# Patient Record
Sex: Female | Born: 1985 | Race: Black or African American | Hispanic: No | Marital: Single | Smoking: Never smoker
Health system: Southern US, Community
[De-identification: ages and names within clinical notes are randomized; demographics above are authoritative.]

## PROBLEM LIST (undated history)

## (undated) DIAGNOSIS — Z789 Other specified health status: Secondary | ICD-10-CM

## (undated) HISTORY — PX: NO PAST SURGERIES: SHX2092

---

## 2015-03-25 ENCOUNTER — Emergency Department
Admission: EM | Admit: 2015-03-25 | Discharge: 2015-03-25 | Disposition: A | Discharge status: Home / Self Care | Payer: Self-pay | Attending: Emergency Medicine | Admitting: Emergency Medicine

## 2015-03-25 ENCOUNTER — Encounter: Payer: Self-pay | Admitting: *Deleted

## 2015-03-25 DIAGNOSIS — M65312 Trigger thumb, left thumb: Secondary | ICD-10-CM | POA: Insufficient documentation

## 2015-03-25 MED ORDER — NAPROXEN 500 MG PO TABS: 500.0000 mg | ORAL_TABLET | Freq: Two times a day (BID) | ORAL | Status: DC

## 2015-03-25 NOTE — ED Notes (Signed)
Left hand pain  

## 2015-03-25 NOTE — ED Provider Notes (Signed)
[facility] Emergency Department Provider Note  ____________________________________________  Time seen: Approximately 11:19 AM  I have reviewed the triage vital signs and the nursing notes.   HISTORY  Chief Complaint Hand Problem    HPI Kaitlin Potter is a 29 y.o. female who presents to the emergency department complaining of left thumb pain and "my thumb not working properly." She states that the symptoms have been increasing over the last month. It is all located around the MCP joint. She denies any numbness or tingling in thumb. She denies any previous injury. She states that she can extend and flex the MCP joint but cannot flex the DIP thumb joint. She states that she can't physically bend it but then the joint will "catch." Pain is mild, intermittent, not improved by over-the-counter medications   History reviewed. No pertinent past medical history.  There are no active problems to display for this patient.   History reviewed. No pertinent past surgical history.  Current Outpatient Rx  Name  Route  Sig  Dispense  Refill  . naproxen (NAPROSYN) 500 MG tablet   Oral   Take 1 tablet (500 mg total) by mouth 2 (two) times daily with a meal.   60 tablet   2     Allergies Review of patient's allergies indicates not on file.  No family history on file.  Social History Social History  Substance Use Topics  . Smoking status: Never Smoker   . Smokeless tobacco: None  . Alcohol Use: No    Review of Systems Constitutional: No fever/chills Eyes: No visual changes. ENT: No sore throat. Cardiovascular: Denies chest pain. Respiratory: Denies shortness of breath. Gastrointestinal: No abdominal pain.  No nausea, no vomiting.  No diarrhea.  No constipation. Genitourinary: Negative for dysuria. Musculoskeletal: Negative for back pain. Endorses left thumb pain and left thumb dysfunction. Skin: Negative for rash. Neurological: Negative for headaches,  focal weakness or numbness.  10-point ROS otherwise negative.  ____________________________________________   PHYSICAL EXAM:  VITAL SIGNS: ED Triage Vitals  Enc Vitals Group     BP 03/25/15 1101 138/64 mmHg     Pulse Rate 03/25/15 1101 77     Resp 03/25/15 1101 18     Temp 03/25/15 1101 98.9 F (37.2 C)     Temp Source 03/25/15 1101 Oral     SpO2 03/25/15 1101 98 %     Weight 03/25/15 1101 230 lb (104.327 kg)     Height 03/25/15 1101 5\' 5"  (1.651 m)     Head Cir --      Peak Flow --      Pain Score 03/25/15 1106 5     Pain Loc --      Pain Edu? --      Excl. in GC? --     Constitutional: Alert and oriented. Well appearing and in no acute distress. Eyes: Conjunctivae are normal. PERRL. EOMI. Head: Atraumatic. Nose: No congestion/rhinnorhea. Mouth/Throat: Mucous membranes are moist.  Oropharynx non-erythematous. Neck: No stridor.   Cardiovascular: Normal rate, regular rhythm. Grossly normal heart sounds.  Good peripheral circulation. Respiratory: Normal respiratory effort.  No retractions. Lungs CTAB. Gastrointestinal: Soft and nontender. No distention. No abdominal bruits. No CVA tenderness. Musculoskeletal: No lower extremity tenderness nor edema.  No joint effusions. No visible deformity to left thumb and compared with right. No edema. Patient is mildly tender to palpation over the sesamoid bones in the MCP joint. No palpable abnormality or deformity. Patient has full range of motion of  the MCP joint but no extension or flexion of the DIP joint. Finkelstein's is negative. When manually manipulated there is a catching sensation and finger has to be manually reduced to return to the extended position. Neurologic:  Normal speech and language. No gross focal neurologic deficits are appreciated. No gait instability. Skin:  Skin is warm, dry and intact. No rash noted. Psychiatric: Mood and affect are normal. Speech and behavior are  normal.  ____________________________________________   LABS (all labs ordered are listed, but only abnormal results are displayed)  Labs Reviewed - No data to display ____________________________________________  EKG   ____________________________________________  RADIOLOGY   ____________________________________________   PROCEDURES  Procedure(s) performed: None  Critical Care performed: No  ____________________________________________   INITIAL IMPRESSION / ASSESSMENT AND PLAN / ED COURSE  Pertinent labs & imaging results that were available during my care of the patient were reviewed by me and considered in my medical decision making (see chart for details).  She is history, symptoms, physical exam are consistent with trigger thumb. I advised patient of findings and diagnosis and she verbalizes understanding same. The patient will be given anti-inflammatories for symptomatic control until patient can see orthopedics. The patient is to be seen by orthopedics for further evaluation and treatment. ____________________________________________   FINAL CLINICAL IMPRESSION(S) / ED DIAGNOSES  Final diagnoses:  Trigger thumb of left hand      Racheal Patches, PA-C 03/25/15 1136  Myrna Blazer, MD 03/25/15 1404

## 2015-03-25 NOTE — Discharge Instructions (Signed)
Trigger Finger °Trigger finger (digital tendinitis and stenosing tenosynovitis) is a common disorder that causes an often painful catching of the fingers or thumb. It occurs as a clicking, snapping, or locking of a finger in the palm of the hand. This is caused by a problem with the tendons that flex or bend the fingers sliding smoothly through their sheaths. The condition may occur in any finger or a couple fingers at the same time.  °The finger may lock with the finger curled or suddenly straighten out with a snap. This is more common in patients with rheumatoid arthritis and diabetes. Left untreated, the condition may get worse to the point where the finger becomes locked in flexion, like making a fist, or less commonly locked with the finger straightened out. °CAUSES  °· Inflammation and scarring that lead to swelling around the tendon sheath. °· Repeated or forceful movements. °· Rheumatoid arthritis, an autoimmune disease that affects joints. °· Gout. °· Diabetes mellitus. °SIGNS AND SYMPTOMS °· Soreness and swelling of your finger. °· A painful clicking or snapping as you bend and straighten your finger. °DIAGNOSIS  °Your health care provider will do a physical exam of your finger to diagnose trigger finger. °TREATMENT  °· Splinting for 6-8 weeks may be helpful. °· Nonsteroidal anti-inflammatory medicines (NSAIDs) can help to relieve the pain and inflammation. °· Cortisone injections, along with splinting, may speed up recovery. Several injections may be required. Cortisone may give relief after one injection. °· Surgery is another treatment that may be used if conservative treatments do not work. Surgery can be minor, without incisions (a cut does not have to be made), and can be done with a needle through the skin. °· Other surgical choices involve an open procedure in which the surgeon opens the hand through a small incision and cuts the pulley so the tendon can again slide smoothly. Your hand will still  work fine. °HOME CARE INSTRUCTIONS °· Apply ice to the injured area, twice per day: °¨ Put ice in a plastic bag. °¨ Place a towel between your skin and the bag. °¨ Leave the ice on for 20 minutes, 3-4 times a day. °· Rest your hand often. °MAKE SURE YOU:  °· Understand these instructions. °· Will watch your condition. °· Will get help right away if you are not doing well or get worse. °  °This information is not intended to replace advice given to you by your health care provider. Make sure you discuss any questions you have with your health care provider. °  °Document Released: 01/31/2004 Document Revised: 12/13/2012 Document Reviewed: 09/12/2012 °Elsevier Interactive Patient Education ©2016 Elsevier Inc. ° °

## 2015-04-27 NOTE — L&D Delivery Note (Signed)
Delivery Note At 7:47 AM a viable female was delivered via Vaginal, Spontaneous Delivery (Presentation: direct OA).  APGAR , weight pending.   Placenta status: spontaneous, intact.  Cord: 3vessels,  with the following complications: tight nuchal unable to be reduced at perineum; delivered through.  Cord pH: not obtained  Anesthesia:  npne Episiotomy:  none Lacerations:  none Suture Repair: n/a Est. Blood Loss (mL):  200ml  Mom to postpartum.  Baby to Couplet care / Skin to Skin.  Precipitous delivery, tight nuchal.  Baby placed on mom's chest.  Cord was doubly clamped and cut by FOB.  IM pitocin given for PPH PPX. Placenta delivered intact, spontaneously.   We did sing Happy Birthday to [employer] Kaitlin Potter.  Chelsea C Ward 04/09/2016, 8:12 AM

## 2015-12-16 ENCOUNTER — Other Ambulatory Visit: Payer: Self-pay | Admitting: Family Medicine

## 2015-12-16 DIAGNOSIS — Z3482 Encounter for supervision of other normal pregnancy, second trimester: Secondary | ICD-10-CM

## 2015-12-22 ENCOUNTER — Ambulatory Visit
Admission: RE | Admit: 2015-12-22 | Discharge: 2015-12-22 | Disposition: A | Discharge status: Home / Self Care | Payer: Self-pay | Source: Ambulatory Visit | Attending: Family Medicine | Admitting: Family Medicine

## 2015-12-22 DIAGNOSIS — Z3482 Encounter for supervision of other normal pregnancy, second trimester: Secondary | ICD-10-CM | POA: Insufficient documentation

## 2015-12-22 DIAGNOSIS — Z3A22 22 weeks gestation of pregnancy: Secondary | ICD-10-CM | POA: Insufficient documentation

## 2016-04-09 ENCOUNTER — Inpatient Hospital Stay
Admission: EM | Admit: 2016-04-09 | Discharge: 2016-04-10 | DRG: 775 | Disposition: A | Discharge status: Home / Self Care | Payer: Medicaid Other | Attending: Obstetrics and Gynecology | Admitting: Obstetrics and Gynecology

## 2016-04-09 ENCOUNTER — Encounter: Payer: Self-pay | Admitting: *Deleted

## 2016-04-09 DIAGNOSIS — Z3A38 38 weeks gestation of pregnancy: Secondary | ICD-10-CM | POA: Diagnosis not present

## 2016-04-09 DIAGNOSIS — O479 False labor, unspecified: Secondary | ICD-10-CM | POA: Diagnosis present

## 2016-04-09 DIAGNOSIS — E669 Obesity, unspecified: Secondary | ICD-10-CM | POA: Diagnosis present

## 2016-04-09 DIAGNOSIS — O99214 Obesity complicating childbirth: Secondary | ICD-10-CM | POA: Diagnosis present

## 2016-04-09 DIAGNOSIS — Z6841 Body Mass Index (BMI) 40.0 and over, adult: Secondary | ICD-10-CM

## 2016-04-09 DIAGNOSIS — O99210 Obesity complicating pregnancy, unspecified trimester: Secondary | ICD-10-CM | POA: Diagnosis present

## 2016-04-09 DIAGNOSIS — O0993 Supervision of high risk pregnancy, unspecified, third trimester: Secondary | ICD-10-CM

## 2016-04-09 DIAGNOSIS — Z3493 Encounter for supervision of normal pregnancy, unspecified, third trimester: Secondary | ICD-10-CM | POA: Diagnosis present

## 2016-04-09 HISTORY — DX: Other specified health status: Z78.9

## 2016-04-09 LAB — TYPE AND SCREEN
ABO/RH(D): A POS
ANTIBODY SCREEN: NEGATIVE

## 2016-04-09 LAB — CBC
HEMATOCRIT: 34.8 % — AB (ref 35.0–47.0)
Hemoglobin: 11.3 g/dL — ABNORMAL LOW (ref 12.0–16.0)
MCH: 23.6 pg — ABNORMAL LOW (ref 26.0–34.0)
MCHC: 32.5 g/dL (ref 32.0–36.0)
MCV: 72.6 fL — ABNORMAL LOW (ref 80.0–100.0)
Platelets: 275 10*3/uL (ref 150–440)
RBC: 4.79 MIL/uL (ref 3.80–5.20)
RDW: 17.3 % — ABNORMAL HIGH (ref 11.5–14.5)
WBC: 14.2 10*3/uL — ABNORMAL HIGH (ref 3.6–11.0)

## 2016-04-09 MED ORDER — OXYTOCIN BOLUS FROM INFUSION: 500.0000 mL | Freq: Once | INTRAVENOUS | Status: DC

## 2016-04-09 MED ORDER — WITCH HAZEL-GLYCERIN EX PADS: 1.0000 "application " | MEDICATED_PAD | CUTANEOUS | Status: DC | PRN

## 2016-04-09 MED ORDER — DOCUSATE SODIUM 100 MG PO CAPS
100.0000 mg | ORAL_CAPSULE | Freq: Two times a day (BID) | ORAL | Status: DC
  Administered 2016-04-10: 100 mg via ORAL
  Filled 2016-04-09: qty 1

## 2016-04-09 MED ORDER — ACETAMINOPHEN 325 MG PO TABS: 650.0000 mg | ORAL_TABLET | ORAL | Status: DC | PRN

## 2016-04-09 MED ORDER — PRENATAL MULTIVITAMIN CH
1.0000 | ORAL_TABLET | Freq: Every day | ORAL | Status: DC
  Administered 2016-04-09 – 2016-04-10 (×2): 1 via ORAL
  Filled 2016-04-09 (×2): qty 1

## 2016-04-09 MED ORDER — BUTORPHANOL TARTRATE 1 MG/ML IJ SOLN: 1.0000 mg | INTRAMUSCULAR | Status: DC | PRN

## 2016-04-09 MED ORDER — ONDANSETRON HCL 4 MG/2ML IJ SOLN: 4.0000 mg | INTRAMUSCULAR | Status: DC | PRN

## 2016-04-09 MED ORDER — SOD CITRATE-CITRIC ACID 500-334 MG/5ML PO SOLN: 30.0000 mL | ORAL | Status: DC | PRN

## 2016-04-09 MED ORDER — OXYTOCIN 10 UNIT/ML IJ SOLN
INTRAMUSCULAR | Status: AC
  Administered 2016-04-09: 10 [IU]
  Filled 2016-04-09: qty 2

## 2016-04-09 MED ORDER — DIBUCAINE 1 % RE OINT: 1.0000 "application " | TOPICAL_OINTMENT | RECTAL | Status: DC | PRN

## 2016-04-09 MED ORDER — ONDANSETRON HCL 4 MG/2ML IJ SOLN: 4.0000 mg | Freq: Four times a day (QID) | INTRAMUSCULAR | Status: DC | PRN

## 2016-04-09 MED ORDER — LACTATED RINGERS IV SOLN: INTRAVENOUS | Status: DC

## 2016-04-09 MED ORDER — ONDANSETRON HCL 4 MG PO TABS: 4.0000 mg | ORAL_TABLET | ORAL | Status: DC | PRN

## 2016-04-09 MED ORDER — ACETAMINOPHEN 500 MG PO TABS: 1000.0000 mg | ORAL_TABLET | Freq: Four times a day (QID) | ORAL | Status: DC | PRN

## 2016-04-09 MED ORDER — LACTATED RINGERS IV SOLN: 500.0000 mL | INTRAVENOUS | Status: DC | PRN

## 2016-04-09 MED ORDER — DIPHENHYDRAMINE HCL 25 MG PO CAPS: 25.0000 mg | ORAL_CAPSULE | Freq: Four times a day (QID) | ORAL | Status: DC | PRN

## 2016-04-09 MED ORDER — OXYTOCIN 40 UNITS IN LACTATED RINGERS INFUSION - SIMPLE MED
INTRAVENOUS | Status: AC
  Filled 2016-04-09: qty 1000

## 2016-04-09 MED ORDER — OXYTOCIN 40 UNITS IN LACTATED RINGERS INFUSION - SIMPLE MED: 2.5000 [IU]/h | INTRAVENOUS | Status: DC

## 2016-04-09 MED ORDER — AMMONIA AROMATIC IN INHA
RESPIRATORY_TRACT | Status: AC
  Filled 2016-04-09: qty 10

## 2016-04-09 MED ORDER — MISOPROSTOL 200 MCG PO TABS
ORAL_TABLET | ORAL | Status: AC
  Filled 2016-04-09: qty 4

## 2016-04-09 MED ORDER — SIMETHICONE 80 MG PO CHEW: 80.0000 mg | CHEWABLE_TABLET | ORAL | Status: DC | PRN

## 2016-04-09 MED ORDER — LIDOCAINE HCL (PF) 1 % IJ SOLN: 30.0000 mL | INTRAMUSCULAR | Status: DC | PRN

## 2016-04-09 MED ORDER — IBUPROFEN 600 MG PO TABS
600.0000 mg | ORAL_TABLET | Freq: Four times a day (QID) | ORAL | Status: DC
  Administered 2016-04-10 (×2): 600 mg via ORAL
  Filled 2016-04-09 (×2): qty 1

## 2016-04-09 MED ORDER — LIDOCAINE HCL (PF) 1 % IJ SOLN
INTRAMUSCULAR | Status: AC
  Filled 2016-04-09: qty 30

## 2016-04-09 MED ORDER — COCONUT OIL OIL: 1.0000 "application " | TOPICAL_OIL | Status: DC | PRN

## 2016-04-09 NOTE — H&P (Signed)
OB History & Physical  COMPLETED AFTER DELIVERY  History of Present Illness:  Chief Complaint:   HPI:  Kaitlin Potter is a 30 y.o. 633P2001 female at 7075w1d dated by 2nd trimester ultrasound with Estimated Date of Delivery: 04/22/16 She presents to L&D with increasing painful contractions  +FM, +CTX, no LOF, no VB  Pregnancy Issues: 1. Obesity 2. Late to care  Maternal Medical History:  History reviewed. No pertinent past medical history.  History reviewed. No pertinent surgical history.  No Known Allergies  Prior to Admission medications   Medication Sig Start Date End Date Taking? Authorizing Provider  naproxen (NAPROSYN) 500 MG tablet Take 1 tablet (500 mg total) by mouth 2 (two) times daily with a meal. 03/25/15   Racheal PatchesJonathan D Cuthriell, PA-C     [facility]   Social History: She  reports that she has never smoked. She has never used smokeless tobacco. She reports that she does not drink alcohol or use drugs.  Family History: family history is not on file.   Review of Systems: A full review of systems was performed and negative except as noted in the HPI.     Physical Exam:  Vital Signs: BP 131/62   Pulse 86   Temp 98.4 F (36.9 C) (Oral)   Resp (!) 24   Breastfeeding? Unknown  General: no acute distress.  HEENT: normocephalic, atraumatic Heart: not assessed - too precipitous Lungs: not assessed - too precipitous; normal respiratory effort Abdomen: soft, gravid, non-tender;  EFW: 7.3 Pelvic:   External: Normal external female genitalia  Cervix: Dilation: 10 / Effacement (%): 100 / Station: -2    Extremities: non-tender, symmetric,DTRs: not assessed - too precipitous  Neurologic: Alert & oriented x 3.    Results for orders placed or performed during the hospital encounter of 04/09/16 (from the past 24 hour(s))  CBC     Status: Abnormal   Collection Time: 04/09/16  7:40 AM  Result Value Ref Range   WBC 14.2 (H) 3.6  - 11.0 K/uL   RBC 4.79 3.80 - 5.20 MIL/uL   Hemoglobin 11.3 (L) 12.0 - 16.0 g/dL   HCT 40.934.8 (L) 81.135.0 - 91.447.0 %   MCV 72.6 (L) 80.0 - 100.0 fL   MCH 23.6 (L) 26.0 - 34.0 pg   MCHC 32.5 32.0 - 36.0 g/dL   RDW 78.217.3 (H) 95.611.5 - 21.314.5 %   Platelets 275 150 - 440 K/uL  Type and screen [facility]     Status: None (Preliminary result)   Collection Time: 04/09/16  7:40 AM  Result Value Ref Range   ABO/RH(D) PENDING    Antibody Screen PENDING    Sample Expiration 04/12/2016     Pertinent Results:  Prenatal Labs: Blood type/Rh A+  Antibody screen neg  Rubella Immune  Varicella Immune  RPR NR  HBsAg Neg  HIV NR  GC neg  Chlamydia neg  Genetic screening Not done  1 hour GTT 150  3 hour GTT 84/166/141/116  GBS negative  TDAP UTD  FHT: TOCO: SVE:  Dilation: 10 / Effacement (%): 100 / Station: -2   Head presenting at perineum upon my exam  Assessment:  Kaitlin Potter is a 30 y.o. 263P2001 female at 3375w1d with precipitous labor.   Plan:  1. Admit to Labor & Delivery 2. See Delivery note  ----- Ranae Plumberhelsea Tomeka Kantner, MD Attending Obstetrician and Gynecologist [facility], Department of OB/GYN [facility]

## 2016-04-09 NOTE — Discharge Summary (Signed)
Obstetrical Discharge Summary  Patient Name: Kaitlin Potter DOB: [birth year] MRN: [id number]  Date of Admission: 04/09/2016 Date of Discharge: 04/10/16 [facility] OB: [facility]  Gestational Age at Delivery: 29104w1d   Antepartum complications:   Obesity, high-risk pregnancy Admitting Diagnosis: labor Secondary Diagnosis: Patient Active Problem List   Diagnosis Date Noted  . Uterine contractions during pregnancy 04/09/2016    Augmentation: none Complications: None Intrapartum complications/course: precipitous labor/delivery.  Presented at 5cm with bulging bag, SROM, with delivery within minutes. Date of Delivery: 04/09/16 Delivered By: Leeroy Bockhelsea Ward Delivery Type: spontaneous vaginal delivery Anesthesia: none Placenta: sponatneous Laceration: none Episiotomy: none Newborn Data: Live born female "Amia" Birth Weight: 3360 grams (7#6.5oz) APGAR: 8, 9   Postpartum Procedures: none  Post partum course:  Patient had an uncomplicated postpartum course.  By time of discharge on PPD#1, her pain was controlled on oral pain medications; she had appropriate lochia and was ambulating, voiding without difficulty and tolerating regular diet.  She was deemed stable for discharge to home.    Discharge Physical Exam: 04/10/16 BP 131/62   Pulse 86   Temp 98.4 F (36.9 C) (Oral)   Resp (!) 24   General: NAD CV: RRR Pulm: CTABL, nl effort ABD: s/nd/nt, fundus firm and below the umbilicus Lochia: moderate DVT Evaluation: LE non-ttp, no evidence of DVT on exam.  Hemoglobin  Date Value Ref Range Status  04/10/2016 10.8 (L) 12.0 - 16.0 g/dL Final   HCT  Date Value Ref Range Status  04/10/2016 34.0 (L) 35.0 - 47.0 % Final     Disposition: stable, discharge to home. Baby Feeding: breast milk and formula Baby Disposition: home with mom  Rh Immune globulin given: n/a Rubella vaccine given: n/a Tdap vaccine given in AP or PP setting: AP Flu  vaccine given in AP or PP setting: unk, offered PP  Contraception: TBD  Prenatal Labs:  Blood type/Rh A+  Antibody screen neg  Rubella Immune  Varicella Immune  RPR NR  HBsAg Neg  HIV NR  GC neg  Chlamydia neg  Genetic screening Not done  1 hour GTT 150  3 hour GTT 84/166/141/116  GBS negative  TDAP UTD    Plan:  Kaitlin Potter was discharged to home in good condition. [facility].  She wants a Mirena IUD for contraception.    Discharge Medications: Allergies as of 04/10/2016   No Known Allergies     Medication List    STOP taking these medications   naproxen 500 MG tablet Commonly known as:  NAPROSYN     TAKE these medications   ibuprofen 600 MG tablet Commonly known as:  ADVIL,MOTRIN Take 1 tablet (600 mg total) by mouth every 6 (six) hours.       Signed:  Carlean JewsMeredith Sigmon, CNM

## 2016-04-09 NOTE — Progress Notes (Signed)
Pt states baby is latching easily to breast, she has given formula at last two feedings, encouraged to breastfeed first then offer formula after, she breast and bottle fed formula with last 2 children for approx. 3-4 mths.

## 2016-04-09 NOTE — Progress Notes (Signed)
Notified by RN about rising blood pressures (new)  One x 160 SBP, with repeat 158.  Will continue to monitor and intervene with anti-hypertensives PRN.  ----- Ranae Plumberhelsea Desjuan Stearns, MD Attending Obstetrician and Gynecologist [facility], Department of OB/GYN [facility]

## 2016-04-10 LAB — CBC
HCT: 34 % — ABNORMAL LOW (ref 35.0–47.0)
HEMOGLOBIN: 10.8 g/dL — AB (ref 12.0–16.0)
MCH: 23.5 pg — AB (ref 26.0–34.0)
MCHC: 31.9 g/dL — AB (ref 32.0–36.0)
MCV: 73.4 fL — ABNORMAL LOW (ref 80.0–100.0)
Platelets: 267 10*3/uL (ref 150–440)
RBC: 4.63 MIL/uL (ref 3.80–5.20)
RDW: 17.5 % — AB (ref 11.5–14.5)
WBC: 12.5 10*3/uL — ABNORMAL HIGH (ref 3.6–11.0)

## 2016-04-10 LAB — RPR: RPR: NONREACTIVE

## 2016-04-10 MED ORDER — IBUPROFEN 600 MG PO TABS: 600.0000 mg | ORAL_TABLET | Freq: Four times a day (QID) | ORAL | 0 refills | Status: AC

## 2016-04-10 NOTE — Progress Notes (Signed)
Patient discharged home with infant. Escorted by nursing. Imagene ShellerMegan Remy Voiles, RN

## 2016-04-10 NOTE — Progress Notes (Addendum)
D/C instructions provided, pt states understanding, aware of follow up appt. Pt received prescription. Pt awaiting ride home.

## 2016-04-10 NOTE — Discharge Instructions (Signed)
Care After Vaginal Delivery °Congratulations on your new baby!! ° °Refer to this sheet in the next few weeks. These discharge instructions provide you with information on caring for yourself after delivery. Your caregiver may also give you specific instructions. Your treatment has been planned according to the most current medical practices available, but problems sometimes occur. Call your caregiver if you have any problems or questions after you go home. ° °HOME CARE INSTRUCTIONS °· Take over-the-counter or prescription medicines only as directed by your caregiver or pharmacist. °· Do not drink alcohol, especially if you are breastfeeding or taking medicine to relieve pain. °· Do not chew or smoke tobacco. °· Do not use illegal drugs. °· Continue to use good perineal care. Good perineal care includes: °¨ Wiping your perineum from front to back. °¨ Keeping your perineum clean. °· Do not use tampons or douche until your caregiver says it is okay. °· Shower, wash your hair, and take tub baths as directed by your caregiver. °· Wear a well-fitting bra that provides breast support. °· Eat healthy foods. °· Drink enough fluids to keep your urine clear or pale yellow. °· Eat high-fiber foods such as whole grain cereals and breads, brown rice, beans, and fresh fruits and vegetables every day. These foods may help prevent or relieve constipation. °· Follow your caregiver's recommendations regarding resumption of activities such as climbing stairs, driving, lifting, exercising, or traveling. Specifically, no driving for two weeks, so that you are comfortable reacting quickly in an emergency. °· Talk to your caregiver about resuming sexual activities. Resumption of sexual activities is dependent upon your risk of infection, your rate of healing, and your comfort and desire to resume sexual activity. Usually we recommend waiting about six weeks, or until your bleeding stops and you are interested in sex. °· Try to have someone  help you with your household activities and your newborn for at least a few days after you leave the hospital. Even longer is better. °· Rest as much as possible. Try to rest or take a nap when your newborn is sleeping. Sleep deprivation can be very hard after delivery. °· Increase your activities gradually. °· Keep all of your scheduled postpartum appointments. It is very important to keep your scheduled follow-up appointments. At these appointments, your caregiver will be checking to make sure that you are healing physically and emotionally. ° °SEEK MEDICAL CARE IF:  °· You are passing large clots from your vagina.  °· You have a foul smelling discharge from your vagina. °· You have trouble urinating. °· You are urinating frequently. °· You have pain when you urinate. °· You have a change in your bowel movements. °· You have increasing redness, pain, or swelling near your vaginal incision (episiotomy) or vaginal tear. °· You have pus draining from your episiotomy or vaginal tear. °· Your episiotomy or vaginal tear is separating. °· You have painful, hard, or reddened breasts. °· You have a severe headache. °· You have blurred vision or see spots. °· You feel sad or depressed. °· You have thoughts of hurting yourself or your newborn. °· You have questions about your care, the care of your newborn, or medicines. °· You are dizzy or light-headed. °· You have a rash. °· You have nausea or vomiting. °· You were breastfeeding and have not had a menstrual period within 12 weeks after you stopped breastfeeding. °· You are not breastfeeding and have not had a menstrual period by the 12th week after delivery. °· You   have a fever. ° °SEEK IMMEDIATE MEDICAL CARE IF:  °· You have persistent pain. °· You have chest pain. °· You have shortness of breath. °· You faint. °· You have leg pain. °· You have stomach pain. °· Your vaginal bleeding saturates two or more sanitary pads in 1 hour. ° °MAKE SURE YOU:  °· Understand these  instructions. °· Will get help right away if you are not doing well or get worse. °·  °Document Released: 04/09/2000 Document Revised: 08/27/2013 Document Reviewed: 12/08/2011 ° °ExitCare® Patient Information ©2015 [employer]. This information is not intended to replace advice given to you by your health care provider. Make sure you discuss any questions you have with your health care provider. ° °

## 2016-04-10 NOTE — Progress Notes (Signed)
PPD #1, SVD, baby girl  S:  Reports feeling good, and would like to go home today             Tolerating po/ No nausea or vomiting             Bleeding is light             Pain controlled with Motrin             Up ad lib / ambulatory / voiding QS  Newborn breast and formula feeding  O:               VS: BP 139/80   Pulse 67   Temp 98.1 F (36.7 C) (Oral)   Resp 18   Ht 5\' 4"  (1.626 m)   Wt 122.5 kg (270 lb)   SpO2 99%   Breastfeeding? Unknown   BMI 46.35 kg/m    LABS:              Recent Labs  04/09/16 0740 04/10/16 0619  WBC 14.2* 12.5*  HGB 11.3* 10.8*  PLT 275 267               Blood type: --/--/A POS (12/15 0740)  Rubella:   Immune                    I&O: Intake/Output      12/15 0701 - 12/16 0700 12/16 0701 - 12/17 0700   Urine (mL/kg/hr) 400 (0.1)    Total Output 400     Net -400          Urine Occurrence 1 x                  Physical Exam:             Alert and oriented X3  Exam deferred until after breakfast    A: PPD # 1  Doing well - stable status  P: Routine post partum orders  Contraception: planning Mirena IUD  Anticipate discharge home later today after baby has 24 hour screening   Carlean JewsMeredith Sigmon, CNM

## 2017-04-27 IMAGING — US US OB COMP +14 WK
1 series · 13 of 28 positions shown · non-contrast
Comparison: none

CLINICAL DATA: Gestational age by LMP of 21 weeks 0 days. Evaluate
dating and anatomy.

EXAM:
OBSTETRICAL ULTRASOUND >14 WKS

[Series 1: us ob comp +14 wk · 0.30mm/px · 13 of 60 slices shown]
[im 3/60]
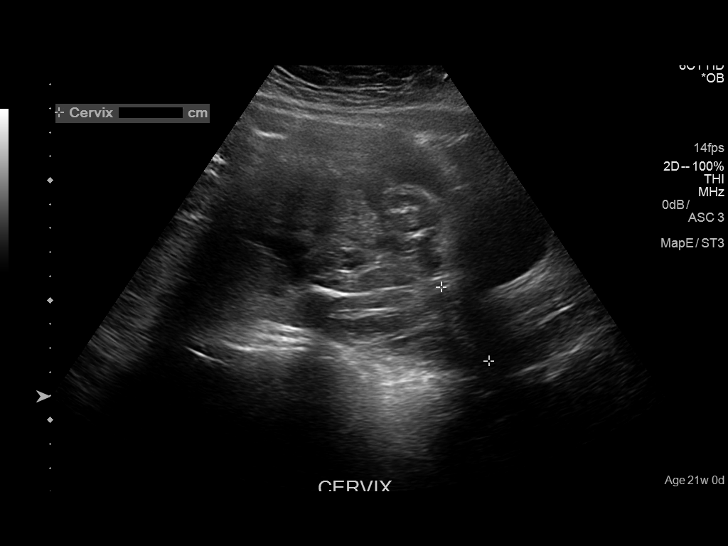
[im 7/60]
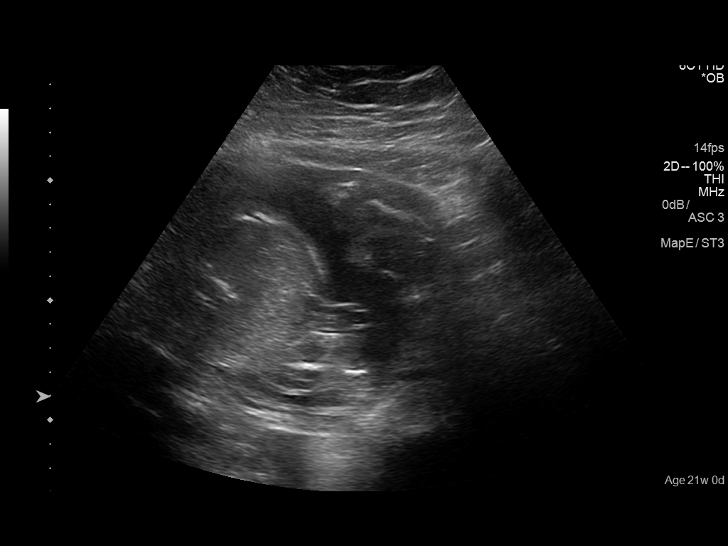
[im 11/60]
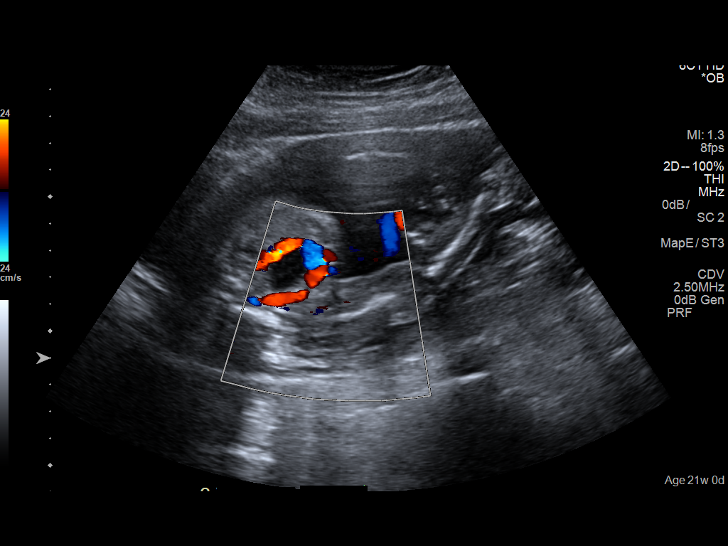
[im 16/60]
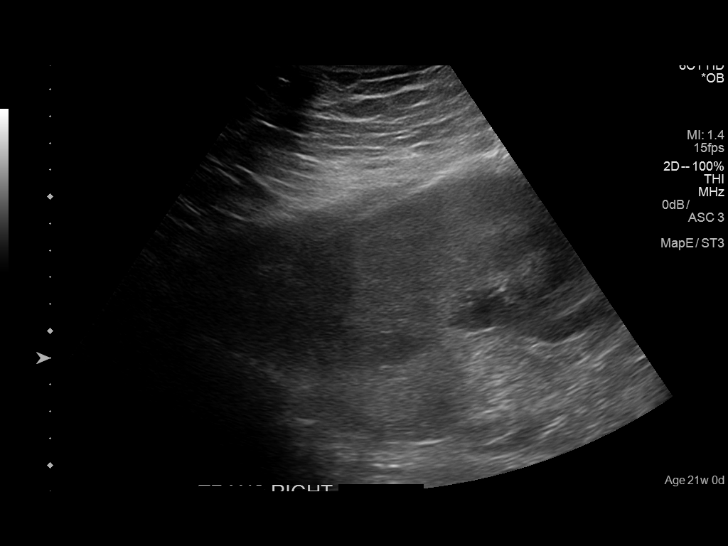
[im 20/60]
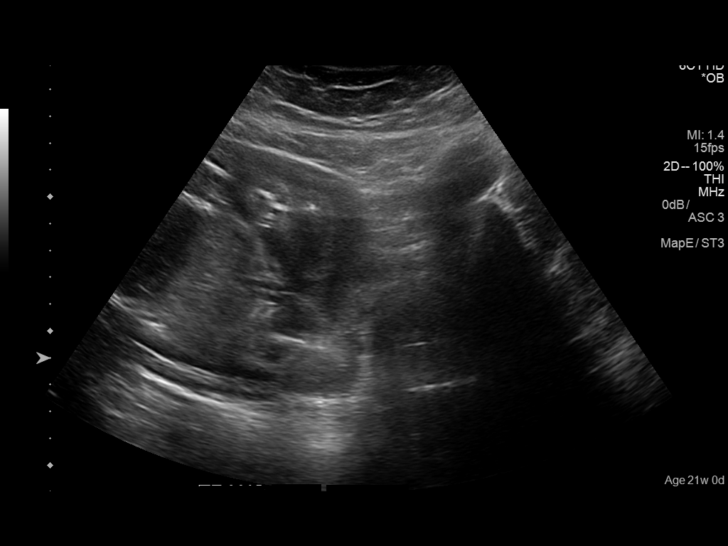
[im 25/60]
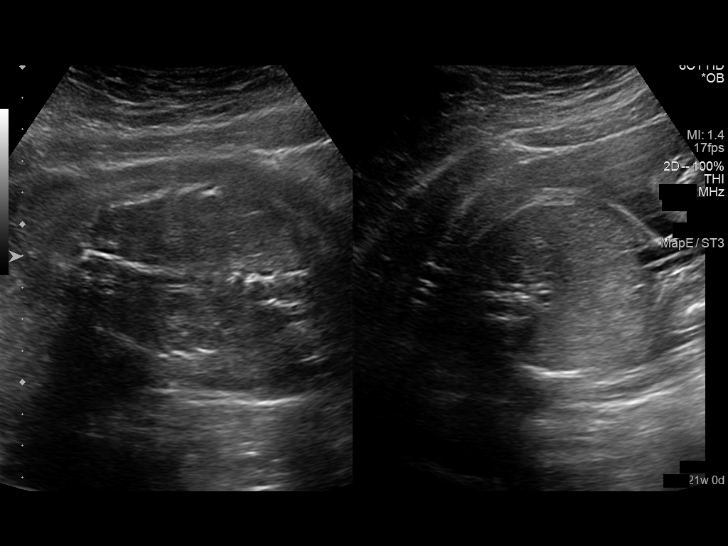
[im 31/60]
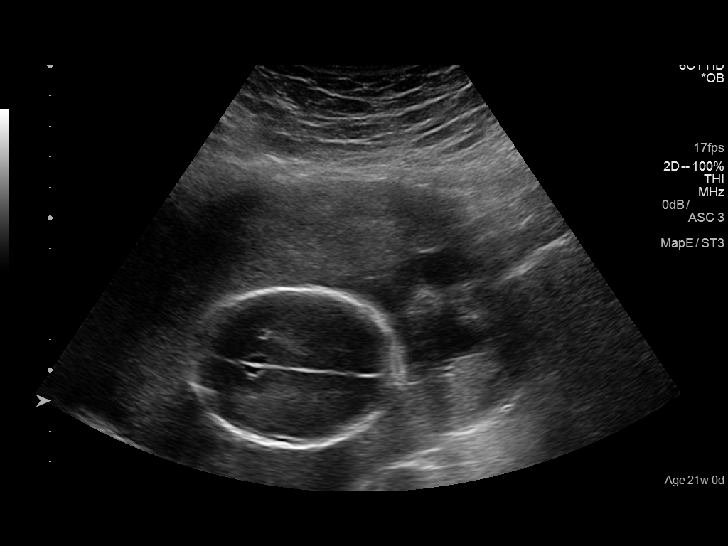
[im 35/60]
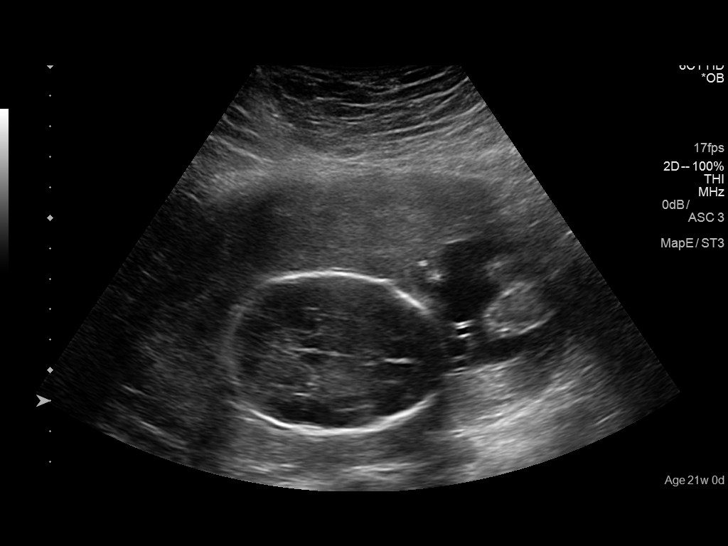
[im 40/60]
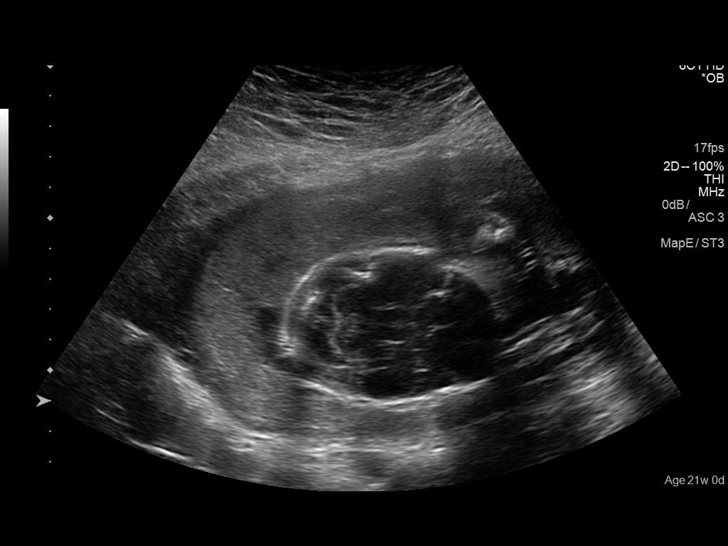
[im 44/60]
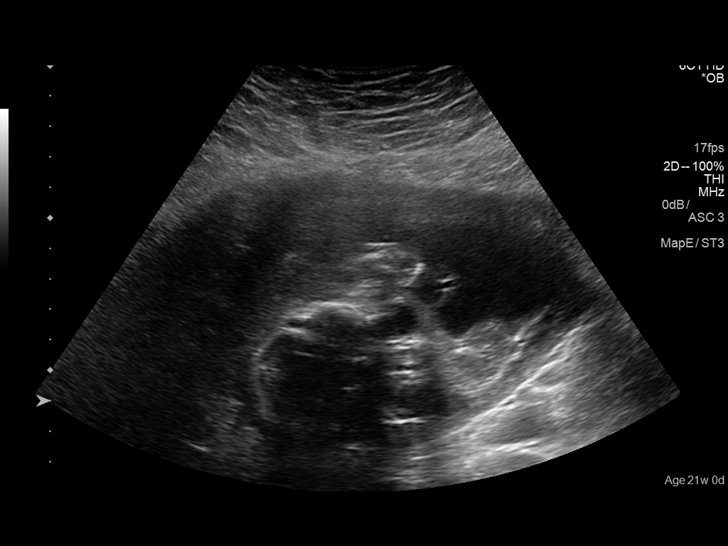
[im 49/60]
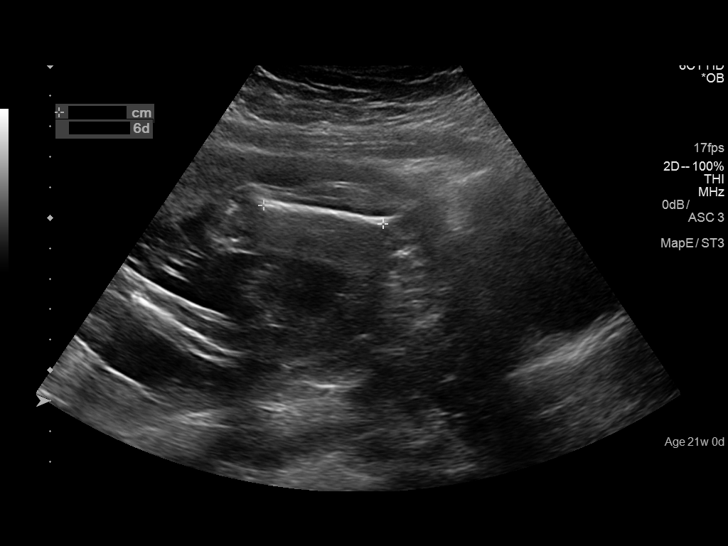
[im 53/60]
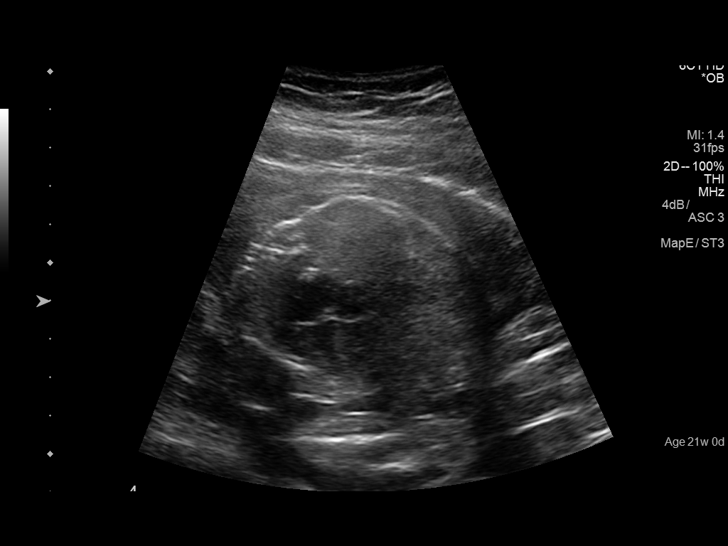
[im 57/60]
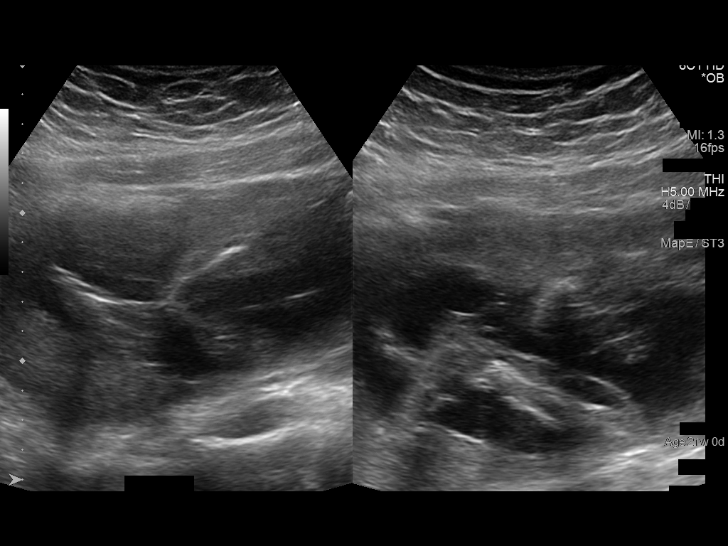

[13 of 28 positions shown; findings below may reference images not displayed]

FINDINGS: Number of Fetuses: 1

Heart Rate:  143 bpm

Movement: Yes

Presentation: Breech

Previa: No

Placental Location: Posterior and right lateral

Amniotic Fluid (Subjective): Within normal limits

Amniotic Fluid (Objective):

Vertical pocket 5.5cm

FETAL BIOMETRY

BPD:  5.1cm 21w 3d

HC:    20.3cm  22w   3d

AC:   18.5cm  23w   2d

FL:   4.0cm  22w   6d

Current Mean GA: 22w 4d              US EDC: 04/22/2016

FETAL ANATOMY

Lateral Ventricles: Appears normal

Thalami/CSP: Appears normal

Posterior Fossa:  Appears normal

Nuchal Region: Appears normal    NFT= n/a

Upper Lip: Not visualized

Spine: Not visualized

4 Chamber Heart on Left: Appears normal

LVOT: Not visualized

RVOT: Not visualized

Stomach on Left: Appears normal

3 Vessel Cord: Appears normal

Cord Insertion site: Appears normal

Kidneys: Appears normal

Bladder: Appears normal

Extremities: Appears normal

Technically difficult due to: Fetal position and maternal habitus

Maternal Findings:

Cervix:  3.7 cm transabdominally
IMPRESSION: Single living IUP measuring 22 weeks 4 days with US EDC of
04/22/2016.

Suboptimal evaluation of fetal anatomy, however no fetal
abnormalities seen involving visualized anatomy listed above.

## 2018-04-22 ENCOUNTER — Encounter: Payer: Self-pay | Admitting: Emergency Medicine

## 2018-04-22 ENCOUNTER — Other Ambulatory Visit: Payer: Self-pay

## 2018-04-22 DIAGNOSIS — R05 Cough: Secondary | ICD-10-CM | POA: Insufficient documentation

## 2018-04-22 DIAGNOSIS — J40 Bronchitis, not specified as acute or chronic: Secondary | ICD-10-CM | POA: Insufficient documentation

## 2018-04-22 NOTE — ED Triage Notes (Signed)
Pt reports cough for 2 months; says she has not had a fever with the cough in about 6 weeks; says she's here tonight because now she just wants to find out what's going on; cough is productive rarely; pt ambulatory with steady gait; talking in complete coherent sentences

## 2018-04-23 ENCOUNTER — Other Ambulatory Visit: Payer: Self-pay

## 2018-04-23 ENCOUNTER — Emergency Department: Payer: Medicaid Other

## 2018-04-23 ENCOUNTER — Emergency Department
Admission: EM | Admit: 2018-04-23 | Discharge: 2018-04-23 | Disposition: A | Discharge status: Home / Self Care | Payer: Medicaid Other | Attending: Emergency Medicine | Admitting: Emergency Medicine

## 2018-04-23 DIAGNOSIS — J40 Bronchitis, not specified as acute or chronic: Secondary | ICD-10-CM

## 2018-04-23 DIAGNOSIS — R05 Cough: Secondary | ICD-10-CM

## 2018-04-23 DIAGNOSIS — R053 Chronic cough: Secondary | ICD-10-CM

## 2018-04-23 MED ORDER — AZITHROMYCIN 250 MG PO TABS: ORAL_TABLET | ORAL | 0 refills | Status: AC

## 2018-04-23 MED ORDER — BENZONATATE 100 MG PO CAPS
100.0000 mg | ORAL_CAPSULE | Freq: Once | ORAL | Status: AC
  Administered 2018-04-23: 100 mg via ORAL
  Filled 2018-04-23 (×2): qty 1

## 2018-04-23 MED ORDER — AZITHROMYCIN 500 MG PO TABS
500.0000 mg | ORAL_TABLET | Freq: Once | ORAL | Status: AC
  Administered 2018-04-23: 500 mg via ORAL
  Filled 2018-04-23: qty 1

## 2018-04-23 MED ORDER — ALBUTEROL SULFATE HFA 108 (90 BASE) MCG/ACT IN AERS: 2.0000 | INHALATION_SPRAY | Freq: Four times a day (QID) | RESPIRATORY_TRACT | 0 refills | Status: AC | PRN

## 2018-04-23 MED ORDER — BENZONATATE 100 MG PO CAPS
100.0000 mg | ORAL_CAPSULE | Freq: Three times a day (TID) | ORAL | 0 refills | Status: AC | PRN
Start: 2018-04-23 — End: 2019-04-23

## 2018-04-23 NOTE — ED Notes (Signed)
Pt needs note for work tomorrow.

## 2018-04-23 NOTE — ED Provider Notes (Signed)
[facility] Emergency Department Provider Note    First MD Initiated Contact with Patient 04/23/18 0222     (approximate)  I have reviewed the triage vital signs and the nursing notes.   HISTORY  Chief Complaint Cough   HPI Kaitlin Potter is a 32 y.o. female with below list of chronic medical conditions presents to the emergency department with a 3675-month history of nonproductive cough.  Patient denies any fever.  Patient denies any lower extremity pain or swelling no history of DVT or PE.  Patient states that she only has chest discomfort when she coughs.   Past Medical History:  Diagnosis Date  . Medical history non-contributory     Patient Active Problem List   Diagnosis Date Noted  . Uterine contractions during pregnancy 04/09/2016  . Obesity affecting pregnancy 04/09/2016  . Supervision of high risk pregnancy in third trimester 04/09/2016  . Precipitous delivery, delivered (current hospitalization) 04/09/2016  . Normal vaginal delivery 04/09/2016    Past Surgical History:  Procedure Laterality Date  . NO PAST SURGERIES      Prior to Admission medications   Medication Sig Start Date End Date Taking? Authorizing Provider  ibuprofen (ADVIL,MOTRIN) 600 MG tablet Take 1 tablet (600 mg total) by mouth every 6 (six) hours. 04/10/16  Yes Sigmon, Meredith C, CNM    Allergies No known drug allergies  Family History  Problem Relation Age of Onset  . Heart disease Father     Social History Social History   Tobacco Use  . Smoking status: Never Smoker  . Smokeless tobacco: Never Used  Substance Use Topics  . Alcohol use: No  . Drug use: No    Review of Systems Constitutional: No fever/chills Eyes: No visual changes. ENT: No sore throat. Cardiovascular: Denies chest pain. Respiratory: Denies shortness of breath.  Positive for cough Gastrointestinal: No abdominal pain.  No nausea, no vomiting.  No diarrhea.  No  constipation. Genitourinary: Negative for dysuria. Musculoskeletal: Negative for neck pain.  Negative for back pain. Integumentary: Negative for rash. Neurological: Negative for headaches, focal weakness or numbness.   ____________________________________________   PHYSICAL EXAM:  VITAL SIGNS: ED Triage Vitals  Enc Vitals Group     BP 04/22/18 2320 134/73     Pulse Rate 04/22/18 2320 83     Resp 04/22/18 2320 17     Temp 04/22/18 2320 98.8 F (37.1 C)     Temp Source 04/22/18 2320 Oral     SpO2 04/22/18 2320 99 %     Weight 04/22/18 2321 108.9 kg (240 lb)     Height 04/22/18 2321 1.676 m (5\' 6" )     Head Circumference --      Peak Flow --      Pain Score 04/22/18 2321 6     Pain Loc --      Pain Edu? --      Excl. in GC? --     Constitutional: Alert and oriented. Well appearing and in no acute distress. Eyes: Conjunctivae are normal.  Mouth/Throat: Mucous membranes are moist. Oropharynx non-erythematous. Neck: No stridor.   Cardiovascular: Normal rate, regular rhythm. Good peripheral circulation. Grossly normal heart sounds. Respiratory: Normal respiratory effort.  No retractions. Lungs CTAB. Gastrointestinal: Soft and nontender. No distention.  Musculoskeletal: No lower extremity tenderness nor edema. No gross deformities of extremities. Neurologic:  Normal speech and language. No gross focal neurologic deficits are appreciated.  Skin:  Skin is warm, dry and intact. No rash noted.  RADIOLOGY I, Kaitlin Potter, personally viewed and evaluated these images (plain radiographs) as part of my medical decision making, as well as reviewing the written report by the radiologist.  ED MD interpretation: No active cardiopulmonary disease noted on chest x-ray.  Official radiology report(s): Dg Chest 2 View  Result Date: 04/23/2018 CLINICAL DATA:  Cough EXAM: CHEST - 2 VIEW COMPARISON:  None. FINDINGS: The heart size and mediastinal contours are within normal limits.  Both lungs are clear. The visualized skeletal structures are unremarkable. IMPRESSION: No active cardiopulmonary disease. Electronically Signed   By: Deatra RobinsonKevin  Herman M.D.   On: 04/23/2018 02:14     Procedures   ____________________________________________   INITIAL IMPRESSION / ASSESSMENT AND PLAN / ED COURSE  As part of my medical decision making, I reviewed the following data within the electronic MEDICAL RECORD NUMBER   32 year old female presenting with above-stated history and physical exam secondary to chronic cough x2 months.  Considered possibly bronchitis pneumonia or other ensure thoracic pathology including sarcoidosis etc.  Chest x-ray performed which revealed no acute intrathoracic pathology.  Spoke with the patient at length regarding treatment with antibiotics Tessalon Perles and albuterol for bronchitis.  Patient advised that if symptoms do not resolve to follow-up with primary care provider for further outpatient evaluation. ____________________________________________  FINAL CLINICAL IMPRESSION(S) / ED DIAGNOSES  Final diagnoses:  Chronic cough  Bronchitis     MEDICATIONS GIVEN DURING THIS VISIT:  Medications  azithromycin (ZITHROMAX) tablet 500 mg (has no administration in time range)  benzonatate (TESSALON) capsule 100 mg (has no administration in time range)     ED Discharge Orders    None       Note:  This document was prepared using Dragon voice recognition software and may include unintentional dictation errors.    Darci CurrentBrown, Franklin N, MD 04/23/18 [phone]

## 2018-04-23 NOTE — ED Notes (Signed)
Pt has dry , bronchitic cough. Pt states rarely productive. Pt denies dyspnea unless coughing. Pt states pain w/ cough x 1 month. Pt states hx of cough x 2 months. Pt denies fever.

## 2020-02-05 IMAGING — CR DG CHEST 2V
1 series · 2 of 2 positions shown · non-contrast
Comparison: None.

CLINICAL DATA: Cough

EXAM:
CHEST - 2 VIEW

[Series 1: dg chest 2 view · 0.14mm/px · 2 of 2 slices shown]
[im 1/2]
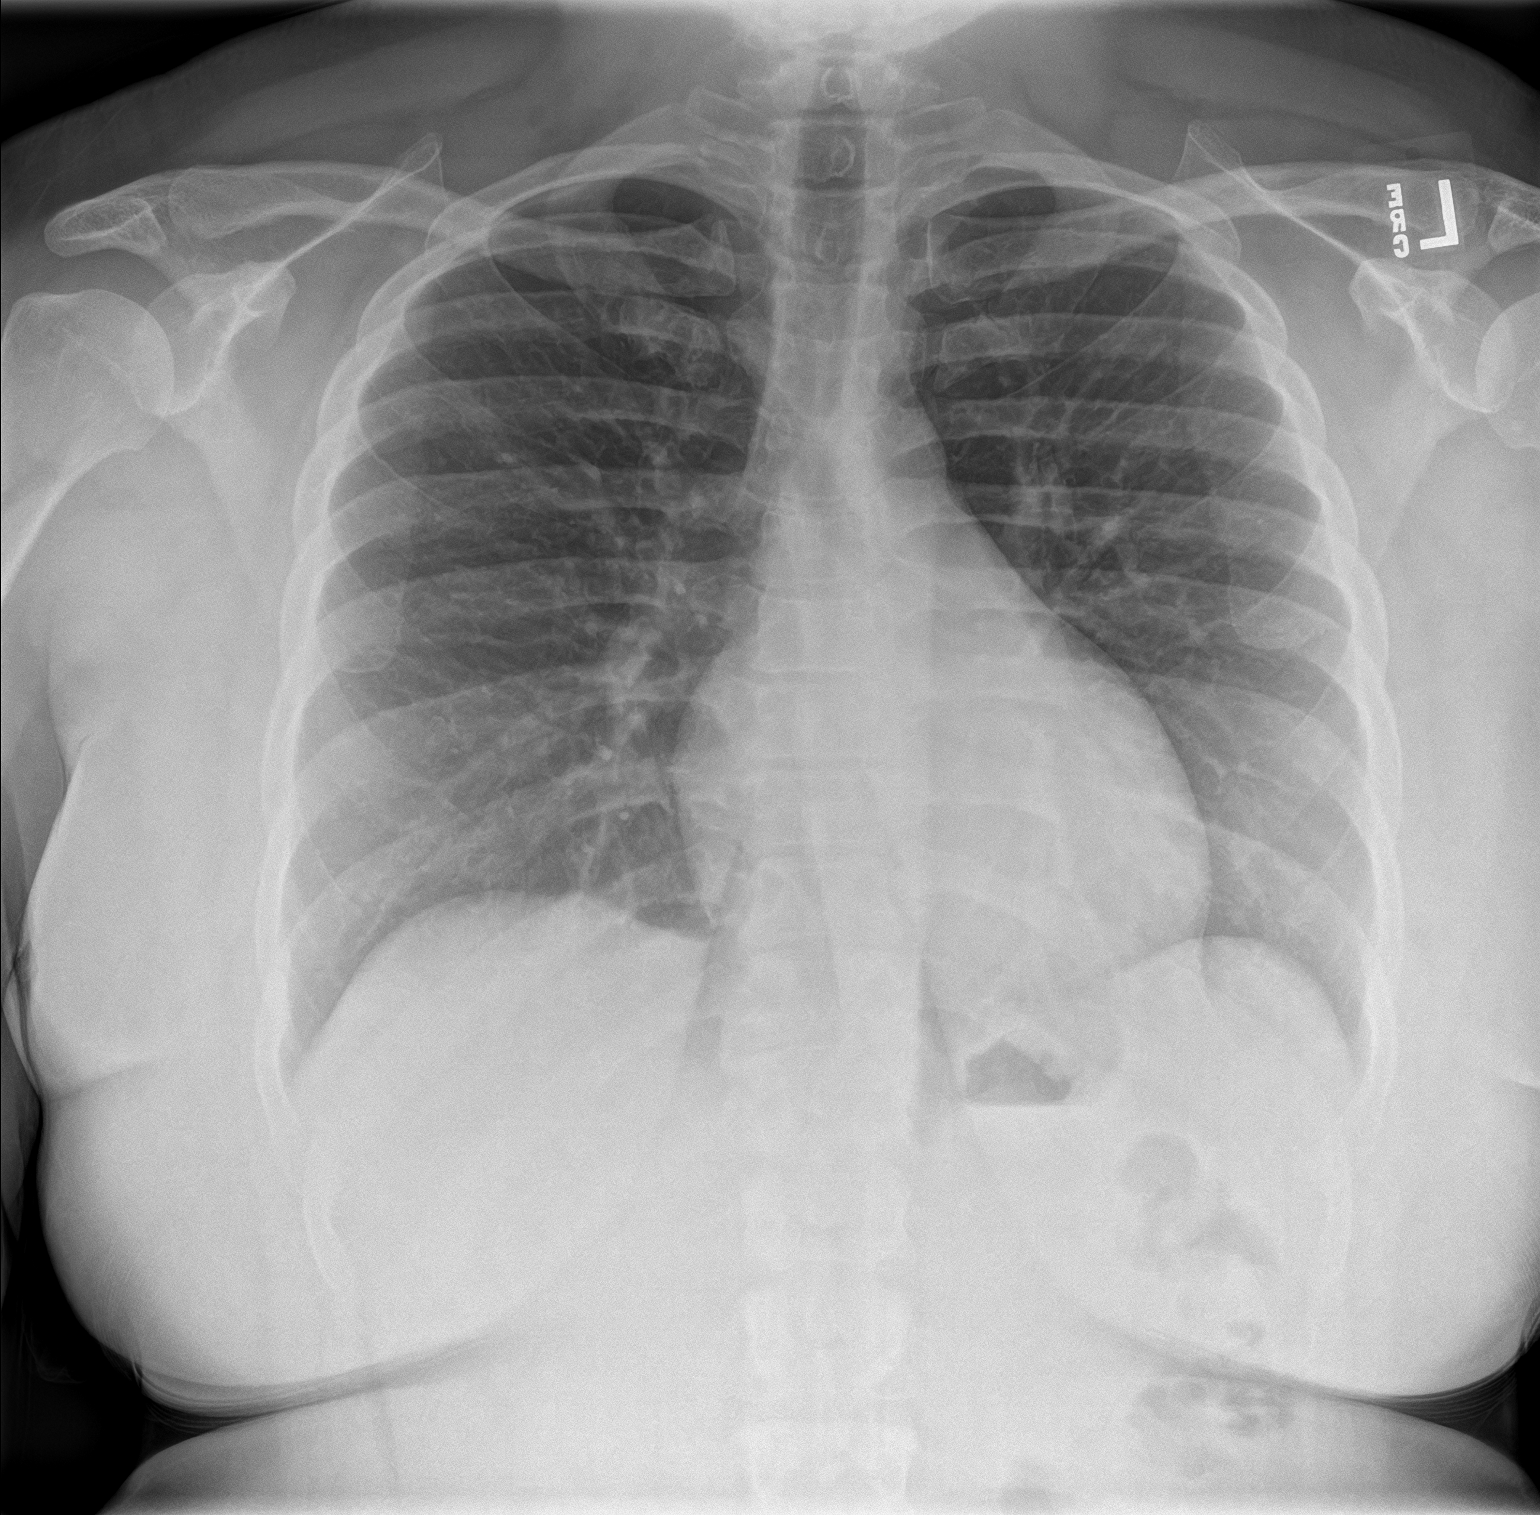
[im 2/2]
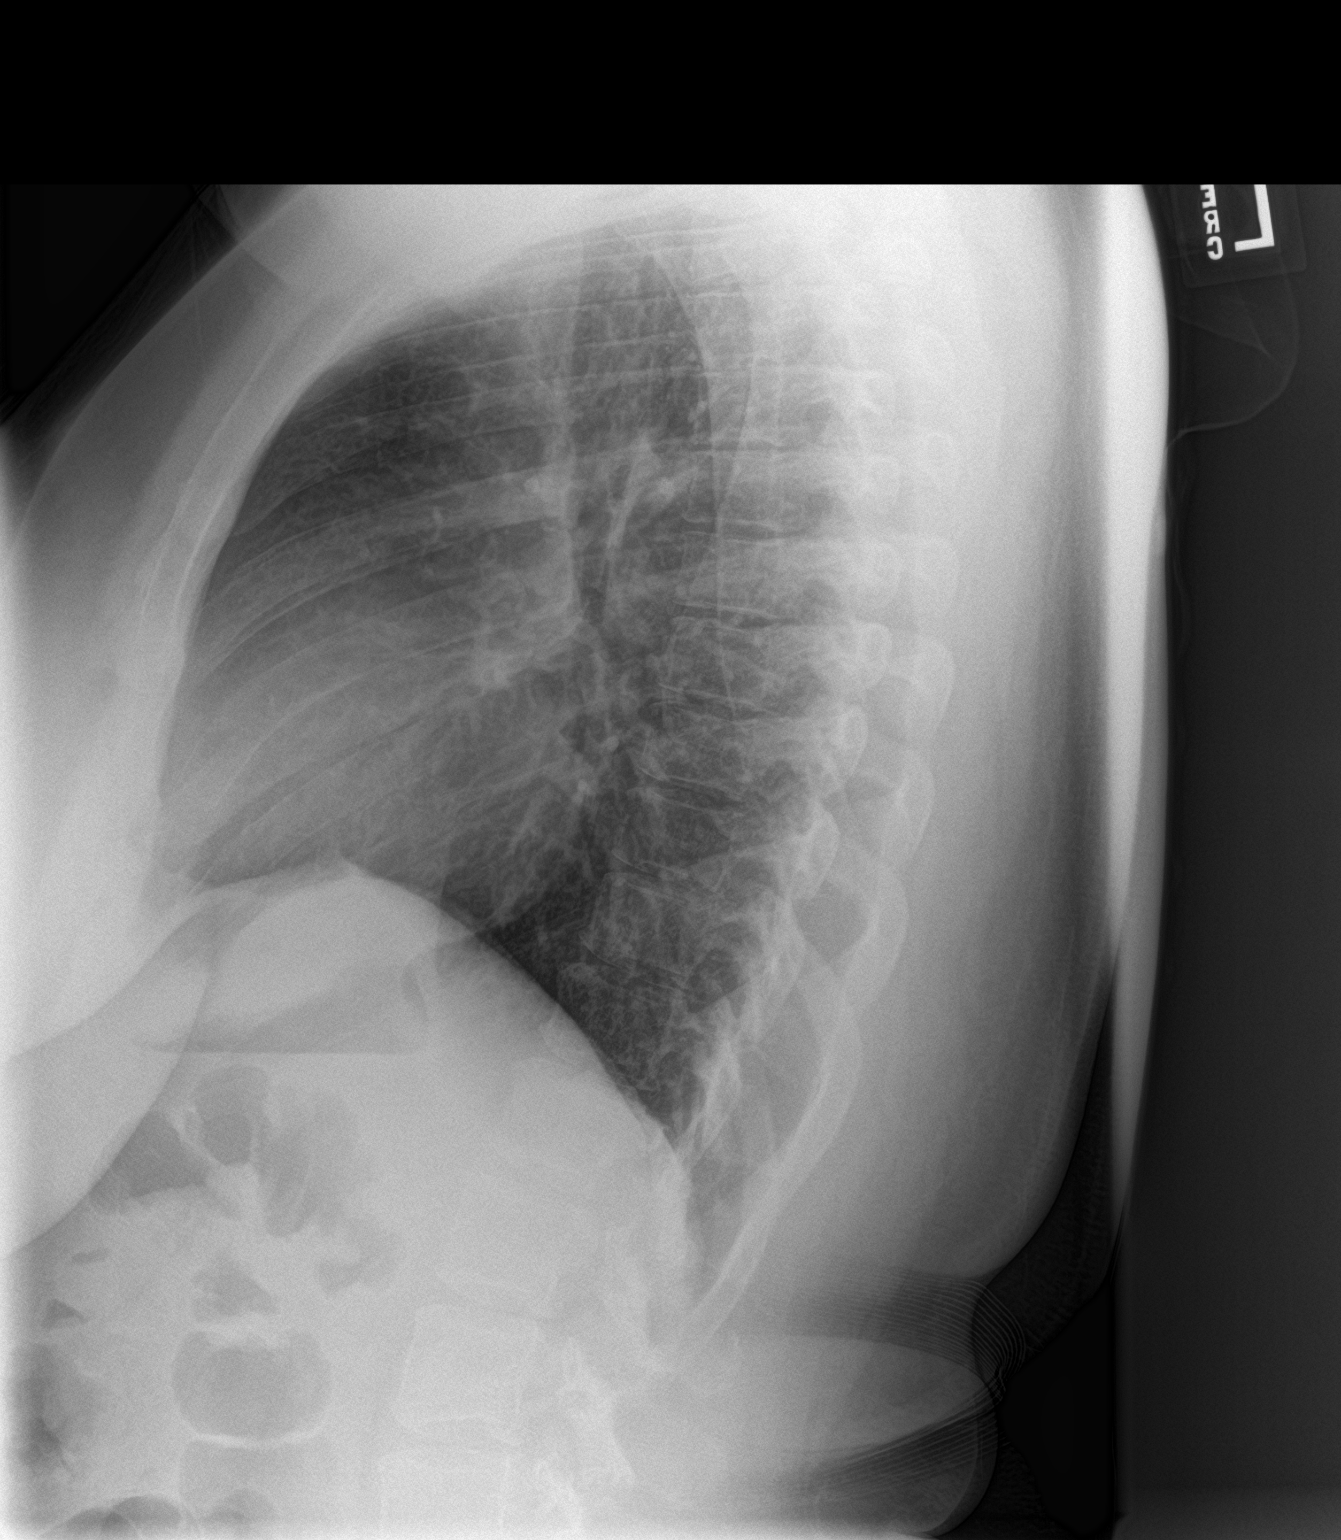

[2 of 2 positions shown; findings below may reference images not displayed]

FINDINGS: The heart size and mediastinal contours are within normal limits.
Both lungs are clear. The visualized skeletal structures are
unremarkable.
IMPRESSION: No active cardiopulmonary disease.

## 2020-08-03 ENCOUNTER — Emergency Department
Admission: EM | Admit: 2020-08-03 | Discharge: 2020-08-03 | Disposition: A | Discharge status: Home / Self Care | Payer: Medicaid Other | Attending: Emergency Medicine | Admitting: Emergency Medicine

## 2020-08-03 ENCOUNTER — Other Ambulatory Visit: Payer: Self-pay

## 2020-08-03 DIAGNOSIS — R519 Headache, unspecified: Secondary | ICD-10-CM | POA: Insufficient documentation

## 2020-08-03 DIAGNOSIS — R220 Localized swelling, mass and lump, head: Secondary | ICD-10-CM | POA: Insufficient documentation

## 2020-08-03 MED ORDER — KETOROLAC TROMETHAMINE 30 MG/ML IJ SOLN
30.0000 mg | Freq: Once | INTRAMUSCULAR | Status: AC
  Administered 2020-08-03: 30 mg via INTRAMUSCULAR
  Filled 2020-08-03: qty 1

## 2020-08-03 MED ORDER — AMOXICILLIN-POT CLAVULANATE 875-125 MG PO TABS: 1.0000 | ORAL_TABLET | Freq: Two times a day (BID) | ORAL | 0 refills | Status: AC

## 2020-08-03 NOTE — Discharge Instructions (Signed)
Please take Augmentin twice daily for 10 days. My current differential includes inflamed lymph node versus scalp cyst. If symptoms do not improve with antibiotic, please make follow-up appointment with primary care provider to discuss dermatology referral.

## 2020-08-03 NOTE — ED Triage Notes (Signed)
Pt comes with c/o knot to back of neck, pt states she noticed it few days ago. Pt states it is painful. No drainage noted.

## 2020-08-03 NOTE — ED Provider Notes (Signed)
[facility]-EMERGENCY DEPARTMENT  ____________________________________________  Time seen: Approximately 1:29 PM  I have reviewed the triage vital signs and the nursing notes.   HISTORY  Chief Complaint knot on head   Historian Patient    HPI Kaitlin Potter is a 35 y.o. female presents to the emergency department with a tender occipital region of her scalp.  Patient states that she noticed it 3 days ago.  She denies recent illness, weight loss or night sweats.  She states that she has never experienced similar symptoms in the past.  She has had a ganglion cyst in the past but never a pilar scalp cyst.  No falls or mechanisms of trauma.  No fever to her knowledge.  No other alleviating measures have been attempted.   Past Medical History:  Diagnosis Date  . Medical history non-contributory      Immunizations up to date:  Yes.     Past Medical History:  Diagnosis Date  . Medical history non-contributory     Patient Active Problem List   Diagnosis Date Noted  . Uterine contractions during pregnancy 04/09/2016  . Obesity affecting pregnancy 04/09/2016  . Supervision of high risk pregnancy in third trimester 04/09/2016  . Precipitous delivery, delivered (current hospitalization) 04/09/2016  . Normal vaginal delivery 04/09/2016    Past Surgical History:  Procedure Laterality Date  . NO PAST SURGERIES      Prior to Admission medications   Medication Sig Start Date End Date Taking? Authorizing Provider  amoxicillin-clavulanate (AUGMENTIN) 875-125 MG tablet Take 1 tablet by mouth 2 (two) times daily for 10 days. 08/03/20 08/13/20 Yes Pia Mau M, PA-C  albuterol (PROVENTIL HFA;VENTOLIN HFA) 108 (90 Base) MCG/ACT inhaler Inhale 2 puffs into the lungs every 6 (six) hours as needed for wheezing or shortness of breath. 04/23/18   Darci Current, MD  ibuprofen (ADVIL,MOTRIN) 600 MG tablet Take 1 tablet (600 mg total) by mouth every 6 (six) hours. 04/10/16   Karena Addison,  CNM    Allergies Patient has no known allergies.  Family History  Problem Relation Age of Onset  . Heart disease Father     Social History Social History   Tobacco Use  . Smoking status: Never Smoker  . Smokeless tobacco: Never Used  Substance Use Topics  . Alcohol use: No  . Drug use: No     Review of Systems  Constitutional: No fever/chills Eyes:  No discharge ENT: No upper respiratory complaints. Respiratory: no cough. No SOB/ use of accessory muscles to breath Gastrointestinal:   No nausea, no vomiting.  No diarrhea.  No constipation. Musculoskeletal: Negative for musculoskeletal pain. Skin: Negative for rash, abrasions, lacerations, ecchymosis.    ____________________________________________   PHYSICAL EXAM:  VITAL SIGNS: ED Triage Vitals  Enc Vitals Group     BP 08/03/20 1205 133/86     Pulse Rate 08/03/20 1205 85     Resp 08/03/20 1205 18     Temp 08/03/20 1205 98.3 F (36.8 C)     Temp Source 08/03/20 1205 Oral     SpO2 08/03/20 1205 98 %     Weight 08/03/20 1203 250 lb (113.4 kg)     Height 08/03/20 1203 5\' 6"  (1.676 m)     Head Circumference --      Peak Flow --      Pain Score 08/03/20 1203 7     Pain Loc --      Pain Edu? --      Excl. in GC? --  Constitutional: Alert and oriented. Well appearing and in no acute distress. Eyes: Conjunctivae are normal. PERRL. EOMI. Head: Atraumatic.  Patient has a 2 cm x 2 cm palpable occipital soft tissue mass.  Region is tender and mobile.  No overlying erythema. ENT:      Nose: No congestion/rhinnorhea.      Mouth/Throat: Mucous membranes are moist.  Neck: No stridor.  No cervical spine tenderness to palpation. Cardiovascular: Normal rate, regular rhythm. Normal S1 and S2.  Good peripheral circulation. Respiratory: Normal respiratory effort without tachypnea or retractions. Lungs CTAB. Good air entry to the bases with no decreased or absent breath sounds Gastrointestinal: Bowel sounds x 4  quadrants. Soft and nontender to palpation. No guarding or rigidity. No distention. Musculoskeletal: Full range of motion to all extremities. No obvious deformities noted Neurologic:  Normal for age. No gross focal neurologic deficits are appreciated.  Skin:  Skin is warm, dry and intact. No rash noted. Psychiatric: Mood and affect are normal for age. Speech and behavior are normal.   ____________________________________________   LABS (all labs ordered are listed, but only abnormal results are displayed)  Labs Reviewed - No data to display ____________________________________________  EKG   ____________________________________________  RADIOLOGY  No results found.  ____________________________________________    PROCEDURES  Procedure(s) performed:     Procedures     Medications  ketorolac (TORADOL) 30 MG/ML injection 30 mg (30 mg Intramuscular Given 08/03/20 1304)     ____________________________________________   INITIAL IMPRESSION / ASSESSMENT AND PLAN / ED COURSE  Pertinent labs & imaging results that were available during my care of the patient were reviewed by me and considered in my medical decision making (see chart for details).      Assessment and plan Scalp mass 35 year old female presents to the emergency department with a 2 cm x 2 cm palpable right sided occipital scalp mass.  Differential diagnosis at this time includes lymphadenopathy versus pilar cyst.  We will treat patient with a trial of Augmentin twice daily for the next 10 days.  Patient has an established primary care provider.  Recommended following up with primary care provider if symptoms do not improve with Augmentin for possible dermatology referral.  Patient felt comfortable with this plan.  All patient questions were answered.     ____________________________________________  FINAL CLINICAL IMPRESSION(S) / ED DIAGNOSES  Final diagnoses:  Scalp pain      NEW MEDICATIONS  STARTED DURING THIS VISIT:  ED Discharge Orders         Ordered    amoxicillin-clavulanate (AUGMENTIN) 875-125 MG tablet  2 times daily        08/03/20 1251              This chart was dictated using voice recognition software/Dragon. Despite best efforts to proofread, errors can occur which can change the meaning. Any change was purely unintentional.     Orvil Feil, PA-C 08/03/20 1335    Sharyn Creamer, MD 08/05/20 2032

## 2023-01-08 ENCOUNTER — Emergency Department
Admission: EM | Admit: 2023-01-08 | Discharge: 2023-01-08 | Disposition: A | Discharge status: Home / Self Care | Payer: 59 | Attending: Student in an Organized Health Care Education/Training Program | Admitting: Student in an Organized Health Care Education/Training Program

## 2023-01-08 ENCOUNTER — Other Ambulatory Visit: Payer: Self-pay

## 2023-01-08 ENCOUNTER — Emergency Department: Payer: 59

## 2023-01-08 DIAGNOSIS — W19XXXA Unspecified fall, initial encounter: Secondary | ICD-10-CM | POA: Diagnosis not present

## 2023-01-08 DIAGNOSIS — S99911A Unspecified injury of right ankle, initial encounter: Secondary | ICD-10-CM | POA: Diagnosis present

## 2023-01-08 DIAGNOSIS — S93401A Sprain of unspecified ligament of right ankle, initial encounter: Secondary | ICD-10-CM | POA: Diagnosis not present

## 2023-01-08 NOTE — ED Notes (Signed)
Patient declined discharge vital signs. 

## 2023-01-08 NOTE — ED Triage Notes (Signed)
Pt comes with c/o right foot injury. Pt states she fell last Monday and it injured it.

## 2023-01-08 NOTE — ED Provider Notes (Signed)
[facility] Provider Note    Event Date/Time   First MD Initiated Contact with Patient 01/08/23 1131     (approximate)   History   Foot Injury   HPI  Movita Ohrt is a 37 y.o. female with no significant past medical history presents emergency department with right foot and ankle injury.  Patient fell last Monday.  Has continued to work on the ankle.  Patient works at the [employer].  States still has swelling and is concerned it is broken.  Denies numbness or tingling      Physical Exam   Triage Vital Signs: ED Triage Vitals  Encounter Vitals Group     BP 01/08/23 1025 (!) 164/90     Systolic BP Percentile --      Diastolic BP Percentile --      Pulse Rate 01/08/23 1025 64     Resp 01/08/23 1025 18     Temp 01/08/23 1025 98 F (36.7 C)     Temp src --      SpO2 01/08/23 1025 100 %     Weight --      Height --      Head Circumference --      Peak Flow --      Pain Score 01/08/23 1024 6     Pain Loc --      Pain Education --      Exclude from Growth Chart --     Most recent vital signs: Vitals:   01/08/23 1025  BP: (!) 164/90  Pulse: 64  Resp: 18  Temp: 98 F (36.7 C)  SpO2: 100%     General: Awake, no distress.   CV:  Good peripheral perfusion. regular rate and  rhythm Resp:  Normal effort.  Abd:  No distention.   Other:  Right ankle with swelling and tenderness along the lateral malleolus, skin is intact, right foot is nontender   ED Results / Procedures / Treatments   Labs (all labs ordered are listed, but only abnormal results are displayed) Labs Reviewed - No data to display   EKG     RADIOLOGY X-ray of the right ankle    PROCEDURES:   Procedures   MEDICATIONS ORDERED IN ED: Medications - No data to display   IMPRESSION / MDM / ASSESSMENT AND PLAN / ED COURSE  I reviewed the triage vital signs and the nursing notes.                              Differential diagnosis includes, but is not  limited to, sprain, fracture, contusion  Patient's presentation is most consistent with acute illness / injury with system symptoms.   X-ray of the right ankle was independently reviewed interpreted by me as being negative for any acute abnormality other than soft tissue swelling.  I did explain these findings to the patient.  Explained her condition is most consistent with a sprained ankle.  She was placed in a cam walker.  Offered crutches.  She is take Tylenol and ibuprofen.  Elevate and ice.  Was given a work note for several days as she does stand at work all day long.  She is in agreement treatment plan.  Discharged stable condition.      FINAL CLINICAL IMPRESSION(S) / ED DIAGNOSES   Final diagnoses:  Sprain of right ankle, unspecified ligament, initial encounter     Rx / DC Orders  ED Discharge Orders     None        Note:  This document was prepared using Dragon voice recognition software and may include unintentional dictation errors.    Nikeisha, Ellyson, PA-C 01/08/23 1304    Willy Eddy, MD 01/08/23 1540

## 2023-08-03 ENCOUNTER — Ambulatory Visit (INDEPENDENT_AMBULATORY_CARE_PROVIDER_SITE_OTHER): Payer: Self-pay | Admitting: Obstetrics

## 2023-08-03 ENCOUNTER — Other Ambulatory Visit (HOSPITAL_COMMUNITY)
Admission: RE | Admit: 2023-08-03 | Discharge: 2023-08-03 | Disposition: A | Discharge status: Home / Self Care | Source: Ambulatory Visit | Attending: Obstetrics | Admitting: Obstetrics

## 2023-08-03 ENCOUNTER — Encounter: Payer: Self-pay | Admitting: Obstetrics

## 2023-08-03 VITALS — BP 144/86 | Ht 66.0 in

## 2023-08-03 DIAGNOSIS — T8332XD Displacement of intrauterine contraceptive device, subsequent encounter: Secondary | ICD-10-CM

## 2023-08-03 DIAGNOSIS — Z124 Encounter for screening for malignant neoplasm of cervix: Secondary | ICD-10-CM

## 2023-08-03 DIAGNOSIS — Z1231 Encounter for screening mammogram for malignant neoplasm of breast: Secondary | ICD-10-CM

## 2023-08-03 DIAGNOSIS — Z113 Encounter for screening for infections with a predominantly sexual mode of transmission: Secondary | ICD-10-CM | POA: Diagnosis present

## 2023-08-03 DIAGNOSIS — Z01419 Encounter for gynecological examination (general) (routine) without abnormal findings: Secondary | ICD-10-CM

## 2023-08-03 DIAGNOSIS — Z7689 Persons encountering health services in other specified circumstances: Secondary | ICD-10-CM

## 2023-08-03 DIAGNOSIS — N643 Galactorrhea not associated with childbirth: Secondary | ICD-10-CM

## 2023-08-03 DIAGNOSIS — Z3200 Encounter for pregnancy test, result unknown: Secondary | ICD-10-CM

## 2023-08-03 DIAGNOSIS — Z3202 Encounter for pregnancy test, result negative: Secondary | ICD-10-CM | POA: Diagnosis not present

## 2023-08-03 LAB — POCT URINE PREGNANCY: Preg Test, Ur: NEGATIVE

## 2023-08-03 NOTE — Progress Notes (Signed)
 ANNUAL GYNECOLOGICAL EXAM  SUBJECTIVE  HPI  Kaitlin Potter is a 38 y.o.-year-old [id number] who presents for an annual gynecological exam today.  She denies pelvic pain, dyspareunia, abnormal vaginal bleeding or discharge, and UTI symptoms. She has an IUD that was placed in 2018. She reports that she has never been able to feel the strings and her last IUD had to be removed surgically. She has not had a period since 2017. She believes she is pregnant because she has breast tenderness and is leaking colostrum, and she feels fetal movement. She had 3 prior term pregnancies.  Medical/Surgical History Past Medical History:  Diagnosis Date   Medical history non-contributory    Past Surgical History:  Procedure Laterality Date   NO PAST SURGERIES      Social History Lives with 3 children and partner. 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 (Menstrual status: IUD). Last Pap: 2023? Contraception: IUD  Prevention Dentist: sees regularly Eye exam: will schedule Mammogram: start at 38 Colonoscopy: at 43 Flu shot/vaccines  Current Medications Outpatient Medications Prior to Visit  Medication Sig   albuterol (PROVENTIL HFA;VENTOLIN HFA) 108 (90 Base) MCG/ACT inhaler Inhale 2 puffs into the lungs every 6 (six) hours as needed for wheezing or shortness of breath.   ibuprofen (ADVIL,MOTRIN) 600 MG tablet Take 1 tablet (600 mg total) by mouth every 6 (six) hours.   No facility-administered medications prior to visit.    UPT negative  ROS Constitutional: Denied constitutional symptoms, night sweats, recent illness, fatigue, fever, insomnia and weight loss.  Eyes: Denied eye symptoms, eye  pain, photophobia, vision change and visual disturbance.  Ears/Nose/Throat/Neck: Denied ear, nose, throat or neck symptoms, hearing loss, nasal discharge, sinus congestion and sore throat.  Cardiovascular: Denied cardiovascular symptoms, arrhythmia, chest pain/pressure, edema, exercise intolerance, orthopnea and palpitations.  Respiratory: Denied pulmonary symptoms, asthma, pleuritic pain, productive sputum, cough, dyspnea and wheezing.  Gastrointestinal: Denied gastro-esophageal reflux, melena, nausea and vomiting.  Genitourinary: Denied genitourinary symptoms including symptomatic vaginal discharge, pelvic relaxation issues, and urinary complaints. See HPI  Musculoskeletal: Denied musculoskeletal symptoms, stiffness, swelling, muscle weakness and myalgia.  Dermatologic: Denied dermatology symptoms, rash and scar.  Neurologic: Denied neurology symptoms, dizziness, headache, neck pain and syncope.  Psychiatric: Denied psychiatric symptoms, anxiety and depression.  Endocrine: Denied endocrine symptoms including hot flashes and night sweats.    OBJECTIVE  BP (!) 144/86   Ht 5\' 6"  (1.676 m)   BMI 40.35 kg/m    Physical examination General NAD, Conversant  HEENT Atraumatic; Op clear with mmm.  Normo-cephalic. Pupils reactive. Anicteric sclerae  Thyroid/Neck Smooth without nodularity or enlargement. Normal ROM.  Neck Supple.  Skin No rashes, lesions or ulceration. Normal palpated skin turgor. No nodularity.  Breasts: No masses or discharge.  Symmetric.  No axillary adenopathy.  Lungs: Clear to auscultation.No rales or wheezes. Normal Respiratory effort, no retractions.  Heart: NSR.  No murmurs or rubs appreciated. No peripheral edema  Abdomen: Soft.  Non-tender.  No masses.  No HSM. No hernia  Extremities: Moves all appropriately.  Normal ROM for age. No lymphadenopathy.  Neuro: Oriented to PPT.  Normal mood. Normal affect.     Pelvic:   Vulva: Normal appearance.  No lesions.  Vagina: No  lesions or abnormalities noted.  Support: Normal pelvic support.  Urethra No masses tenderness or scarring.  Meatus Normal size without lesions or prolapse.  Cervix: Normal appearance.  No lesions. IUD strings not visualized.  Anus: Normal exam.  No lesions.  Perineum: Normal exam.  No lesions.        Bimanual   Uterus: Normal size.  Non-tender.  Mobile.  AV.  Adnexae: No masses.  Non-tender to palpation.  Cul-de-sac: Negative for abnormality.    ASSESSMENT  1) Annual exam 2) Due for Pap 3) IUD in place, strings not visible 4) Galactorrhea  PLAN 1) Physical exam as noted. Discussed healthy lifestyle choices and preventive care. STI testing today. Labs: A1C, CBC, CMP, TSH/T4. UPT negative but serum hcg ordered to confirm per patient preference. 2) Pap collected. F/u based on results. 3) IUD due to be replaced in early 2026. Pelvic US ordered to confirm position before removal attempt d/t history of difficult IUD removals and strings not visible. 4) Prolactin ordered. Mammogram at 38.  Return in one year for annual exam or as needed for concerns.   Guadlupe Spanish, CNM

## 2023-08-04 ENCOUNTER — Other Ambulatory Visit: Payer: Self-pay | Admitting: Obstetrics

## 2023-08-04 ENCOUNTER — Encounter: Payer: Self-pay | Admitting: Obstetrics

## 2023-08-04 DIAGNOSIS — E0590 Thyrotoxicosis, unspecified without thyrotoxic crisis or storm: Secondary | ICD-10-CM

## 2023-08-04 LAB — COMPREHENSIVE METABOLIC PANEL WITH GFR
ALT: 16 IU/L (ref 0–32)
AST: 17 IU/L (ref 0–40)
Albumin: 4.1 g/dL (ref 3.9–4.9)
Alkaline Phosphatase: 89 IU/L (ref 44–121)
BUN/Creatinine Ratio: 16 (ref 9–23)
BUN: 9 mg/dL (ref 6–20)
Bilirubin Total: 0.3 mg/dL (ref 0.0–1.2)
CO2: 20 mmol/L (ref 20–29)
Calcium: 9.2 mg/dL (ref 8.7–10.2)
Chloride: 106 mmol/L (ref 96–106)
Creatinine, Ser: 0.57 mg/dL (ref 0.57–1.00)
Globulin, Total: 2.5 g/dL (ref 1.5–4.5)
Glucose: 77 mg/dL (ref 70–99)
Potassium: 4.1 mmol/L (ref 3.5–5.2)
Sodium: 138 mmol/L (ref 134–144)
Total Protein: 6.6 g/dL (ref 6.0–8.5)
eGFR: 118 mL/min/{1.73_m2} (ref >59)

## 2023-08-04 LAB — TSH+FREE T4
Free T4: 2.07 ng/dL — ABNORMAL HIGH (ref 0.82–1.77)
TSH: <0.005 u[IU]/mL — ABNORMAL LOW (ref 0.450–4.500)

## 2023-08-04 LAB — PROLACTIN: Prolactin: 6.6 ng/mL (ref 4.8–33.4)

## 2023-08-04 LAB — LIPID PANEL
Chol/HDL Ratio: 3.5 ratio (ref 0.0–4.4)
Cholesterol, Total: 153 mg/dL (ref 100–199)
HDL: 44 mg/dL (ref >39)
LDL Chol Calc (NIH): 96 mg/dL (ref 0–99)
Triglycerides: 66 mg/dL (ref 0–149)
VLDL Cholesterol Cal: 13 mg/dL (ref 5–40)

## 2023-08-04 LAB — RPR, QUANT. (REFLEX): Rapid Plasma Reagin, Quant: 1:32 {titer} — ABNORMAL HIGH

## 2023-08-04 LAB — HEMOGLOBIN A1C
Est. average glucose Bld gHb Est-mCnc: 123 mg/dL
Hgb A1c MFr Bld: 5.9 % — ABNORMAL HIGH (ref 4.8–5.6)

## 2023-08-04 LAB — HEP, RPR, HIV PANEL
HIV Screen 4th Generation wRfx: NONREACTIVE
Hepatitis B Surface Ag: NEGATIVE
RPR Ser Ql: REACTIVE — AB

## 2023-08-04 LAB — HUMAN CHORIONIC GONADOTROPIN(HCG),B-SUBUNIT,QUANTITATIVE): HCG, Beta Chain, Quant, S: <1 m[IU]/mL

## 2023-08-05 ENCOUNTER — Ambulatory Visit: Admitting: Family Medicine

## 2023-08-05 ENCOUNTER — Encounter: Payer: Self-pay | Admitting: Family Medicine

## 2023-08-05 DIAGNOSIS — A539 Syphilis, unspecified: Secondary | ICD-10-CM

## 2023-08-05 LAB — CYTOLOGY - PAP
Chlamydia: NEGATIVE
Comment: NEGATIVE
Comment: NEGATIVE
Comment: NEGATIVE
Comment: NORMAL
Diagnosis: NEGATIVE
High risk HPV: NEGATIVE
Neisseria Gonorrhea: NEGATIVE
Trichomonas: POSITIVE — AB

## 2023-08-05 MED ORDER — PENICILLIN G BENZATHINE 1200000 UNIT/2ML IM SUSY
2.4000 10*6.[IU] | PREFILLED_SYRINGE | INTRAMUSCULAR | Status: DC
  Administered 2023-08-05: 2.4 10*6.[IU] via INTRAMUSCULAR

## 2023-08-05 NOTE — Progress Notes (Signed)
 Client tolerated Bicillin injections with minimal complaint. Counseled to walk up and down [facility] hallway x15 minutes and agreed to do so. Appt for set of Bicillin injections #2 scheduled for 08/15/23 as agency closed 08/12/23. Client counseled if did not keep appt, Bicillin series would need to be restarted and she verbalized understanding. Appt for Bicillin injections #3 scheduled for 08/22/23. Client declined appt reminder card as able to see appts in My Chart. Jossie Ng, RN

## 2023-08-05 NOTE — Progress Notes (Signed)
 [facility] Department STI clinic [address] [place] [address] Main phone: [phone]  STI screening visit  Subjective:  Kaitlin Potter is a 38 y.o. female being seen today for an STI screening visit. The patient reports they do not have symptoms.  Patient reports that they do not desire a pregnancy in the next year.   They reported they are not interested in discussing contraception today.    No LMP recorded. (Menstrual status: IUD).  Patient has the following medical conditions:  Patient Active Problem List   Diagnosis Date Noted   Syphilis 08/05/2023   Chief Complaint  Patient presents with   SEXUALLY TRANSMITTED DISEASE    HPI HPI Patient reports to clinic as a referral from OBGYN for pos RPR on 08/03/23  Does the patient using douching products? No  Last HIV test per patient/review of record was No results found for: "HMHIVSCREEN"  Lab Results  Component Value Date   HIV Non Reactive 08/03/2023     Last HEPC test per patient/review of record was No results found for: "HMHEPCSCREEN" No components found for: "HEPC"   Last HEPB test per patient/review of record was No components found for: "HMHEPBSCREEN"   Patient reports last pap was: 08/03/23  No results found for: "DIAGPAP", "HPVHIGH", "ADEQPAP" No results found for: "SPECADGYN" Result Date Procedure Results Follow-ups  08/03/2023 Cytology - PAP      Screening for MPX risk: Does the patient have an unexplained rash? No Is the patient MSM? No Does the patient endorse multiple sex partners or anonymous sex partners? No Did the patient have close or sexual contact with a person diagnosed with MPX? No Has the patient traveled outside the [place] where MPX is endemic? No Is there a high clinical suspicion for MPX-- evidenced by one of the following No  -Unlikely to be chickenpox  -Lymphadenopathy  -Rash that present in same phase of evolution on any given body part See flowsheet  for further details and programmatic requirements.   Immunization history:   There is no immunization history on file for this patient.   The following portions of the patient's history were reviewed and updated as appropriate: allergies, current medications, past medical history, past social history, past surgical history and problem list.  Objective:  There were no vitals filed for this visit.  Physical Exam Vitals and nursing note reviewed.  Constitutional:      Appearance: Normal appearance.  HENT:     Head: Normocephalic and atraumatic.     Mouth/Throat:     Mouth: Mucous membranes are moist.     Pharynx: Oropharynx is clear. No oropharyngeal exudate or posterior oropharyngeal erythema.  Pulmonary:     Effort: Pulmonary effort is normal.  Abdominal:     General: Abdomen is flat.     Palpations: There is no mass.     Tenderness: There is no abdominal tenderness. There is no rebound.  Genitourinary:    Comments: Declined genital exam- done 2 days ago Lymphadenopathy:     Head:     Right side of head: No preauricular or posterior auricular adenopathy.     Left side of head: No preauricular or posterior auricular adenopathy.     Cervical: No cervical adenopathy.     Upper Body:     Right upper body: No supraclavicular, axillary or epitrochlear adenopathy.     Left upper body: No supraclavicular, axillary or epitrochlear adenopathy.  Skin:    General: Skin is warm and  dry.     Findings: No rash.  Neurological:     Mental Status: She is alert and oriented to person, place, and time.     Assessment and Plan:  Kaitlin Potter is a 38 y.o. female presenting to the [facility] Department for STI screening  1. Syphilis (Primary) -no known negatives in the last year -no symptoms- full gyne exam done at OBGYN 2 days ago  -last sex with partner was in Feb, no other partners (partner from [place], and went back to [place]) -consult done with Shawn Stall ([facility])- who  requested confirmatory testing be done -will be treated with 3 doses of bicillin   Patient accepted screenings including RPR/Confirmatory testing. Patient meets criteria for HepB screening? No. Ordered? not applicable Patient meets criteria for HepC screening? No. Ordered? not applicable  Treat wet prep per standing order Discussed time line for State Lab results and that patient will be called with positive results and encouraged patient to call if she had not heard in 2 weeks.  Counseled to return or seek care for continued or worsening symptoms Recommended repeat testing in 3 months with positive results. Recommended condom use with all sex for STI prevention.   Patient is currently using *Mirena to prevent pregnancy.    Return in about 1 week (around 08/12/2023) for bic.  Future Appointments  Date Time Provider Department Center  08/24/2023 11:15 AM [facility] [place] 1 [facility] None    Lenice Llamas, [place]

## 2023-08-06 ENCOUNTER — Other Ambulatory Visit: Payer: Self-pay | Admitting: Obstetrics

## 2023-08-06 ENCOUNTER — Encounter: Payer: Self-pay | Admitting: Obstetrics

## 2023-08-06 MED ORDER — METRONIDAZOLE 500 MG PO TABS: 500.0000 mg | ORAL_TABLET | Freq: Two times a day (BID) | ORAL | 0 refills | Status: AC

## 2023-08-06 NOTE — Progress Notes (Signed)
+  trichomoniasis on Pap smear. Rx for metronidazole 500 mg PO BID x 7 days sent to pharmacy. Kaitlin Potter informed of infection and need for her partner to also be treated.  Jordie Skalsky M Leira Regino, CNM

## 2023-08-08 ENCOUNTER — Telehealth: Payer: Self-pay | Admitting: Family Medicine

## 2023-08-09 NOTE — Telephone Encounter (Signed)
 Pt was called to explain to her about Lab results

## 2023-08-15 ENCOUNTER — Ambulatory Visit

## 2023-08-15 DIAGNOSIS — A539 Syphilis, unspecified: Secondary | ICD-10-CM

## 2023-08-15 LAB — SYPHILIS SEROLOGY, ~~LOC~~ LAB
RPR, Quant: 1:64 {titer}
RPR: REACTIVE
Syphilis Treponemal Ab: REACTIVE

## 2023-08-15 NOTE — Progress Notes (Signed)
 Client seen in nurse clinic for [facility] #2.  Client inquired if her Confirmation test results were back.  Results are not yet posted to the state lab.  Client unsure if she wants to get second round of shots today.  She reported that both her current partner and past partner were tested and were both negative.  She really wants to see if her confirmation test is positive prior to receiving the Bicillin  dose number 2.  Client was encouraged to continue treatment.  Client made aware if she did not get 2nd set of shots today, that she would have to restart the series.    Client decided to wait to get shot until test results are back. Client informed we would call her back as soon as the results were back, and that if they came back today she could return this afternoon to get injections.     Client called at 3:41 to let her know test results are back and are positive, and that treatment needs to be continued.  Client stated will come this afternoon for Bicillin  as ordered.   Client called 4:59 to see if she would be able to make today for Bicillin .   She was not able to make it, because was still at work..  She will need to be called to reschedule.     Client did NOT receive injections today.    Again Client aware she will need to restart treatment series, and will need  3 more set of Bicillin  Injections.  Vaughn Beaumier Sandrea Cruel, RN

## 2023-08-19 ENCOUNTER — Telehealth: Payer: Self-pay

## 2023-08-19 ENCOUNTER — Encounter

## 2023-08-19 NOTE — Telephone Encounter (Signed)
 Phone call to pt at [phone]. Pt answered and confirmed identity. RN calling to schedule tx appt, understand she was not able to make it back to [facility] the other day. Pt currently at work.  Requested late afternoon appt for today. Pt scheduled as an overbook for 08/19/23. Pt counseled that if she is unable to leave work today, she still has an appt scheduled for Monday 08/22/23, check in 8:20 AM.

## 2023-08-22 ENCOUNTER — Ambulatory Visit

## 2023-08-22 ENCOUNTER — Other Ambulatory Visit: Payer: Self-pay | Admitting: Family Medicine

## 2023-08-22 DIAGNOSIS — A539 Syphilis, unspecified: Secondary | ICD-10-CM

## 2023-08-22 MED ORDER — PENICILLIN G BENZATHINE 2400000 UNIT/4ML IM SUSY
2.4000 10*6.[IU] | PREFILLED_SYRINGE | INTRAMUSCULAR | Status: AC
  Administered 2023-08-22 – 2023-08-29 (×2): 2400000 [IU] via INTRAMUSCULAR

## 2023-08-22 NOTE — Progress Notes (Signed)
 Patient had lapse in bicillin  treatment as she was waiting for RPR results to return for confirmation.   RPR results are back and positive.   New orders for bicillin  x 3 placed in this orders only encounter.   Tempie Fee, MD 08/22/23  10:36 AM

## 2023-08-22 NOTE — Progress Notes (Signed)
 In nurse clinic for Syphilis tx / Bicillin  #1 (restart). Voices no concerns. Bicillin  tx given today per new order by Bohdan Bush, MD dated 08/22/2023. Tolerated well. Patient walked for 10 mins after injections, no problems.   Bicillin  #2 scheduled for 08/29/2023 at 8:20 AM; patient aware.   Clare Critchley, RN

## 2023-08-23 NOTE — Telephone Encounter (Addendum)
 Pt kept 08/22/23 tx appt for Bicillin  restart.  #1: 08/22/23 complete #2: (08/29/23 scheduled) - 5/5 completed #3: (09/05/23 scheduled)

## 2023-08-24 ENCOUNTER — Ambulatory Visit

## 2023-08-24 ENCOUNTER — Other Ambulatory Visit: Payer: Self-pay | Admitting: Obstetrics

## 2023-08-24 DIAGNOSIS — T8332XD Displacement of intrauterine contraceptive device, subsequent encounter: Secondary | ICD-10-CM

## 2023-08-24 DIAGNOSIS — N643 Galactorrhea not associated with childbirth: Secondary | ICD-10-CM

## 2023-08-24 DIAGNOSIS — Z01419 Encounter for gynecological examination (general) (routine) without abnormal findings: Secondary | ICD-10-CM

## 2023-08-24 DIAGNOSIS — Z1231 Encounter for screening mammogram for malignant neoplasm of breast: Secondary | ICD-10-CM

## 2023-08-24 DIAGNOSIS — Z30431 Encounter for routine checking of intrauterine contraceptive device: Secondary | ICD-10-CM

## 2023-08-24 DIAGNOSIS — Z124 Encounter for screening for malignant neoplasm of cervix: Secondary | ICD-10-CM

## 2023-08-24 DIAGNOSIS — Z3200 Encounter for pregnancy test, result unknown: Secondary | ICD-10-CM

## 2023-08-24 DIAGNOSIS — Z7689 Persons encountering health services in other specified circumstances: Secondary | ICD-10-CM

## 2023-08-24 DIAGNOSIS — Z113 Encounter for screening for infections with a predominantly sexual mode of transmission: Secondary | ICD-10-CM

## 2023-08-26 ENCOUNTER — Encounter: Payer: Self-pay | Admitting: Obstetrics

## 2023-08-29 ENCOUNTER — Ambulatory Visit

## 2023-08-29 DIAGNOSIS — A539 Syphilis, unspecified: Secondary | ICD-10-CM

## 2023-08-29 NOTE — Progress Notes (Addendum)
 In nurse clinic for Bicillin  #2 / Syphilis tx. States pain, swelling at injection sites from last week's injections. States she is walking a lot and using heating pad with minimal relief. Denies sexual activity since treatment started.   Consult Fain Home, FNP and informed of patient status. Advises patient to continue to use heating pad, warm bath, tylenol /ibuprofen  prn, walking to help discomfort. Provider states soreness and swelling will resolve. RN discussed provider recommendations with patient. Verbalizes understanding.   Bicillin  2.43mu/4ml given IM. Tolerated injections with some discomfort at injection site. Patient advised to stay for 15 min, but states she is going to get breakfast and has to work. Advised to seek immediate medical attention if needed if serious adverse reaction. Has Final Bicillin  (#3) scheduled 09/05/2023, has reminder. Roshanda Balazs, RN

## 2023-09-05 ENCOUNTER — Encounter

## 2023-09-05 NOTE — Telephone Encounter (Signed)
 Placed several calls to pt this AM, trying to reach her re missed tx appt this morning. Attempted to leave voicemail messages. Due to phone connection unsure if pt received all information. --- Received call from [facility] Dept, Rozella Cornfield, RN. Pt showed to their clinic this AM requesting tx. Adah Acron requests lab and last two tx visits at [facility] for their records to tx pt with #3 bicillin  today. Fax # [phone]. Phone [phone]. --- [facility] tx records regarding bicillin  tx #3 today to be faxed to [facility] CD fax when completed.

## 2023-09-07 NOTE — Progress Notes (Signed)
 Records received from [facility] Department.  Patient completed Syphilis treatment with 3rd dose of Bicillin  2.4 MU.  See copy of scanned records.   #1 08/22/2023 ([facility]) #2 08/29/2023 ([facility]) #3 09/05/2023 [facility])

## 2023-09-13 NOTE — Telephone Encounter (Signed)
 Pt presented to [facility] on 09/05/23 where bicillin  #3 was administered. See scanned documents in Media.

## 2023-10-11 ENCOUNTER — Ambulatory Visit: Admitting: "Endocrinology

## 2023-10-26 ENCOUNTER — Ambulatory Visit: Admitting: "Endocrinology

## 2023-10-26 ENCOUNTER — Encounter: Payer: Self-pay | Admitting: "Endocrinology

## 2023-10-26 VITALS — BP 118/80 | HR 85 | Ht 66.0 in | Wt 239.0 lb

## 2023-10-26 DIAGNOSIS — E0590 Thyrotoxicosis, unspecified without thyrotoxic crisis or storm: Secondary | ICD-10-CM

## 2023-10-26 NOTE — Progress Notes (Signed)
 Outpatient Endocrinology Note Kaitlin Birmingham, MD  10/26/23   Kaitlin Potter 1986/01/24 [id number]  Referring Provider: Justino Eleanor HERO, CNM Primary Care Provider: Center, [facility] Subjective  No chief complaint on file.   Assessment & Plan  Diagnoses and all orders for this visit:  Hyperthyroidism -     TSH -     T4, free -     T3, free -     TRAb (TSH Receptor Binding Antibody) -     Thyroid stimulating immunoglobulin -     CBC with Differential/Platelet   Kaitlin Potter is currently no taking medication. 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  Discussed the etiology for hyperthyroidism. Educated on thyroid axis.  Recommend the following: repeat labs today and inform about pregnancy as soon as confirmed. Discussed both PTU and methimazole with S/E, attached handouts.  Repeat labs in 3 months or sooner if symptoms of hyper or hypothyroidism develop.  Counseled on: -complications of untreated hyperthyroidism including atrial fibrillation, heart failure and osteoporosis -side effects of Methimazole including but not limited to allergic reaction, rash, bone marrow suppression, liver dysfunction and teratogenic potential -implications in pregnancy  -compliance and follow up needs    If you notice any symptoms of worsening fatigue, fever with sore throat, loss of appetite, yellowing of eyes, dark urine, joint pains, sores in the mouth, itchy rash, light colored stools or abdominal pain, please stop the medication and call us  immediately as this can be a serious side effect of the medication.   I have reviewed current medications, nurse's notes, allergies, vital signs, past medical and surgical history, family medical history, and social history for this encounter. Counseled patient on symptoms, examination findings, lab findings, imaging results, treatment decisions and  monitoring and prognosis. The patient understood the recommendations and agrees with the treatment plan. All questions regarding treatment plan were fully answered.   Return in about 3 months (around 01/26/2024) for visit + labs before next visit, labs today.   Kaitlin Birmingham, MD  10/26/23   I have reviewed current medications, nurse's notes, allergies, vital signs, past medical and surgical history, family medical history, and social history for this encounter. Counseled patient on symptoms, examination findings, lab findings, imaging results, treatment decisions and monitoring and prognosis. The patient understood the recommendations and agrees with the treatment plan. All questions regarding treatment plan were fully answered.   History of Present Illness Kaitlin Potter is a 38 y.o. year old female who presents to our clinic with hyperthyroidism diagnosed in 07/2023.    Patient thinks she is pregnant, she will get it confirmed next week. On birth control    Never been on thyroid medication No thyroid disease in family  Symptoms suggestive of HYPOTHYROIDISM:  fatigue No weight gain No cold intolerance  No constipation  No  Symptoms suggestive of HYPERTHYROIDISM:  weight loss  No heat intolerance No hyperdefecation  No palpitations  No  Compressive symptoms:  dysphagia  No dysphonia  No positional dyspnea (especially with simultaneous arms elevation)  No  Smokes  No On biotin  No Personal history of head/neck surgery/irradiation  No  Physical Exam  BP 118/80   Pulse 85   Ht 5' 6 (1.676 m)   Wt 239 lb (108.4 kg)   SpO2 99%   BMI 38.58 kg/m  Constitutional: well developed, well nourished Head: normocephalic, atraumatic, no exophthalmos Eyes: sclera anicteric, no redness Neck:  no thyromegaly, no thyroid tenderness; no nodules palpated Lungs: normal respiratory effort Neurology: alert and oriented, no fine hand tremor Skin: dry, no appreciable  rashes Musculoskeletal: no appreciable defects Psychiatric: normal mood and affect  Allergies No Known Allergies  Current Medications Patient's Medications  New Prescriptions   No medications on file  Previous Medications   ALBUTEROL  (PROVENTIL  HFA;VENTOLIN  HFA) 108 (90 BASE) MCG/ACT INHALER    Inhale 2 puffs into the lungs every 6 (six) hours as needed for wheezing or shortness of breath.   IBUPROFEN  (ADVIL ,MOTRIN ) 600 MG TABLET    Take 1 tablet (600 mg total) by mouth every 6 (six) hours.   METRONIDAZOLE  (FLAGYL ) 500 MG TABLET    Take 1 tablet (500 mg total) by mouth 2 (two) times daily.   MULTIPLE VITAMIN (MULTIVITAMIN WITH MINERALS) TABS TABLET    Take 1 tablet by mouth daily.  Modified Medications   No medications on file  Discontinued Medications   No medications on file    Past Medical History Past Medical History:  Diagnosis Date   Medical history non-contributory     Past Surgical History Past Surgical History:  Procedure Laterality Date   NO PAST SURGERIES      Family History family history includes Heart disease in her father.  Social History Social History   Socioeconomic History   Marital status: Single    Spouse name: Not on file   Number of children: Not on file   Years of education: Not on file   Highest education level: Not on file  Occupational History   Not on file  Tobacco Use   Smoking status: Never   Smokeless tobacco: Never  Vaping Use   Vaping status: Never Used  Substance and Sexual Activity   Alcohol use: No   Drug use: No   Sexual activity: Yes    Birth control/protection: I.U.D.    Comment: iud since 2018  Other Topics Concern   Not on file  Social History Narrative   Not on file   Social Drivers of Health   Financial Resource Strain: Not on file  Food Insecurity: Not on file  Transportation Needs: Not on file  Physical Activity: Not on file  Stress: Not on file  Social Connections: Not on file  Intimate Partner  Violence: Not on file    Laboratory Investigations Lab Results  Component Value Date   TSH <0.005 (L) 08/03/2023   FREET4 2.07 (H) 08/03/2023     No results found for: TSI   No components found for: TRAB   Lab Results  Component Value Date   CHOL 153 08/03/2023   Lab Results  Component Value Date   HDL 44 08/03/2023   Lab Results  Component Value Date   LDLCALC 96 08/03/2023   Lab Results  Component Value Date   TRIG 66 08/03/2023   Lab Results  Component Value Date   CHOLHDL 3.5 08/03/2023   Lab Results  Component Value Date   CREATININE 0.57 08/03/2023   No results found for: GFR    Component Value Date/Time   NA 138 08/03/2023 1012   K 4.1 08/03/2023 1012   CL 106 08/03/2023 1012   CO2 20 08/03/2023 1012   GLUCOSE 77 08/03/2023 1012   BUN 9 08/03/2023 1012   CREATININE 0.57 08/03/2023 1012   CALCIUM 9.2 08/03/2023 1012   PROT 6.6 08/03/2023 1012   ALBUMIN 4.1 08/03/2023 1012   AST 17 08/03/2023 1012   ALT 16 08/03/2023 1012  ALKPHOS 89 08/03/2023 1012   BILITOT 0.3 08/03/2023 1012      Latest Ref Rng & Units 08/03/2023   10:12 AM  BMP  Glucose 70 - 99 mg/dL 77   BUN 6 - 20 mg/dL 9   Creatinine 9.42 - 8.99 mg/dL 9.42   BUN/Creat Ratio 9 - 23 16   Sodium 134 - 144 mmol/L 138   Potassium 3.5 - 5.2 mmol/L 4.1   Chloride 96 - 106 mmol/L 106   CO2 20 - 29 mmol/L 20   Calcium 8.7 - 10.2 mg/dL 9.2        Component Value Date/Time   WBC 12.5 (H) 04/10/2016 0619   RBC 4.63 04/10/2016 0619   HGB 10.8 (L) 04/10/2016 0619   HCT 34.0 (L) 04/10/2016 0619   PLT 267 04/10/2016 0619   MCV 73.4 (L) 04/10/2016 0619   MCH 23.5 (L) 04/10/2016 0619   MCHC 31.9 (L) 04/10/2016 0619   RDW 17.5 (H) 04/10/2016 9380      Parts of this note may have been dictated using voice recognition software. There may be variances in spelling and vocabulary which are unintentional. Not all errors are proofread. Please notify the dino if any discrepancies are noted or  if the meaning of any statement is not clear.

## 2023-11-02 ENCOUNTER — Ambulatory Visit: Payer: Self-pay | Admitting: "Endocrinology

## 2023-11-02 LAB — CBC WITH DIFFERENTIAL/PLATELET
Absolute Lymphocytes: 2854 {cells}/uL (ref 850–3900)
Absolute Monocytes: 717 {cells}/uL (ref 200–950)
Basophils Absolute: 20 {cells}/uL (ref 0–200)
Basophils Relative: 0.3 %
Eosinophils Absolute: 47 {cells}/uL (ref 15–500)
Eosinophils Relative: 0.7 %
HCT: 40.6 % (ref 35.0–45.0)
Hemoglobin: 12.4 g/dL (ref 11.7–15.5)
MCH: 24 pg — ABNORMAL LOW (ref 27.0–33.0)
MCHC: 30.5 g/dL — ABNORMAL LOW (ref 32.0–36.0)
MCV: 78.7 fL — ABNORMAL LOW (ref 80.0–100.0)
MPV: 11.1 fL (ref 7.5–12.5)
Monocytes Relative: 10.7 %
Neutro Abs: 3062 {cells}/uL (ref 1500–7800)
Neutrophils Relative %: 45.7 %
Platelets: 290 Thousand/uL (ref 140–400)
RBC: 5.16 Million/uL — ABNORMAL HIGH (ref 3.80–5.10)
RDW: 12.9 % (ref 11.0–15.0)
Total Lymphocyte: 42.6 %
WBC: 6.7 Thousand/uL (ref 3.8–10.8)

## 2023-11-02 LAB — TSH: TSH: <0.01 m[IU]/L — ABNORMAL LOW

## 2023-11-02 LAB — T3, FREE: T3, Free: 9.1 pg/mL — ABNORMAL HIGH (ref 2.3–4.2)

## 2023-11-02 LAB — T4, FREE: Free T4: 2.7 ng/dL — ABNORMAL HIGH (ref 0.8–1.8)

## 2023-11-02 LAB — TRAB (TSH RECEPTOR BINDING ANTIBODY): TRAB: 11.62 IU/L — ABNORMAL HIGH (ref <=2.00)

## 2023-11-02 LAB — THYROID STIMULATING IMMUNOGLOBULIN: TSI: 305 %{baseline} — ABNORMAL HIGH (ref <140)

## 2023-11-04 ENCOUNTER — Other Ambulatory Visit: Payer: Self-pay | Admitting: "Endocrinology

## 2023-11-04 DIAGNOSIS — E0590 Thyrotoxicosis, unspecified without thyrotoxic crisis or storm: Secondary | ICD-10-CM

## 2023-11-04 MED ORDER — PROPYLTHIOURACIL 50 MG PO TABS: 50.0000 mg | ORAL_TABLET | Freq: Three times a day (TID) | ORAL | 1 refills | Status: AC

## 2024-01-11 ENCOUNTER — Other Ambulatory Visit

## 2024-01-18 ENCOUNTER — Ambulatory Visit: Admitting: "Endocrinology

## 2024-01-25 ENCOUNTER — Other Ambulatory Visit

## 2024-02-01 ENCOUNTER — Ambulatory Visit: Admitting: "Endocrinology

## 2024-03-14 ENCOUNTER — Other Ambulatory Visit

## 2024-03-14 LAB — CBC WITH DIFFERENTIAL/PLATELET
Absolute Lymphocytes: 2325 {cells}/uL (ref 850–3900)
Absolute Monocytes: 651 {cells}/uL (ref 200–950)
Basophils Absolute: 19 {cells}/uL (ref 0–200)
Basophils Relative: 0.3 %
Eosinophils Absolute: 37 {cells}/uL (ref 15–500)
Eosinophils Relative: 0.6 %
HCT: 37.6 % (ref 35.0–45.0)
Hemoglobin: 11.9 g/dL (ref 11.7–15.5)
MCH: 24.9 pg — ABNORMAL LOW (ref 27.0–33.0)
MCHC: 31.6 g/dL — ABNORMAL LOW (ref 32.0–36.0)
MCV: 78.7 fL — ABNORMAL LOW (ref 80.0–100.0)
MPV: 11.3 fL (ref 7.5–12.5)
Monocytes Relative: 10.5 %
Neutro Abs: 3168 {cells}/uL (ref 1500–7800)
Neutrophils Relative %: 51.1 %
Platelets: 249 Thousand/uL (ref 140–400)
RBC: 4.78 Million/uL (ref 3.80–5.10)
RDW: 13.3 % (ref 11.0–15.0)
Total Lymphocyte: 37.5 %
WBC: 6.2 Thousand/uL (ref 3.8–10.8)

## 2024-03-16 LAB — TSH: TSH: <0.01 m[IU]/L — ABNORMAL LOW

## 2024-03-16 LAB — T3, FREE: T3, Free: 10.5 pg/mL — ABNORMAL HIGH (ref 2.3–4.2)

## 2024-03-16 LAB — T4, FREE: Free T4: 2.8 ng/dL — ABNORMAL HIGH (ref 0.8–1.8)

## 2024-03-21 ENCOUNTER — Ambulatory Visit: Admitting: "Endocrinology

## 2024-04-05 ENCOUNTER — Telehealth: Payer: Self-pay

## 2024-04-05 NOTE — Telephone Encounter (Signed)
 Pt notified, transferred to the front desk to schedule lab appt.

## 2024-04-05 NOTE — Telephone Encounter (Signed)
-----   Message from Dartha Ernst, MD sent at 03/16/2024 12:04 PM EST ----- Please let the patient know that he needs to labs before his next appointment with me as well as a bone density test that I have reordered, it seems like previously it did not go through.  Thanks

## 2024-04-11 ENCOUNTER — Other Ambulatory Visit
